# Patient Record
Sex: Male | Born: 1975 | Race: White | Hispanic: No | Marital: Married | Smoking: Former smoker
Health system: Southern US, Community
[De-identification: ages and names within clinical notes are randomized; demographics above are authoritative.]

## PROBLEM LIST (undated history)

## (undated) DIAGNOSIS — G8929 Other chronic pain: Secondary | ICD-10-CM

## (undated) DIAGNOSIS — K5720 Diverticulitis of large intestine with perforation and abscess without bleeding: Secondary | ICD-10-CM

## (undated) DIAGNOSIS — F411 Generalized anxiety disorder: Secondary | ICD-10-CM

## (undated) DIAGNOSIS — K219 Gastro-esophageal reflux disease without esophagitis: Secondary | ICD-10-CM

## (undated) DIAGNOSIS — K6389 Other specified diseases of intestine: Secondary | ICD-10-CM

## (undated) DIAGNOSIS — Z720 Tobacco use: Secondary | ICD-10-CM

## (undated) DIAGNOSIS — Z933 Colostomy status: Secondary | ICD-10-CM

## (undated) DIAGNOSIS — K5790 Diverticulosis of intestine, part unspecified, without perforation or abscess without bleeding: Secondary | ICD-10-CM

---
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
## 2005-01-13 ENCOUNTER — Emergency Department: Payer: Self-pay | Admitting: Emergency Medicine

## 2008-06-05 ENCOUNTER — Inpatient Hospital Stay: Payer: Self-pay | Admitting: Internal Medicine

## 2008-07-11 ENCOUNTER — Ambulatory Visit: Payer: Self-pay | Admitting: Surgery

## 2010-05-23 ENCOUNTER — Encounter: Payer: Self-pay | Admitting: Physician Assistant

## 2010-06-06 ENCOUNTER — Encounter: Payer: Self-pay | Admitting: Physician Assistant

## 2014-03-16 DIAGNOSIS — F411 Generalized anxiety disorder: Secondary | ICD-10-CM | POA: Insufficient documentation

## 2015-07-17 ENCOUNTER — Encounter: Payer: Self-pay | Admitting: Emergency Medicine

## 2015-07-17 ENCOUNTER — Emergency Department
Admission: EM | Admit: 2015-07-17 | Discharge: 2015-07-17 | Disposition: A | Discharge status: Home / Self Care | Payer: Managed Care, Other (non HMO) | Attending: Emergency Medicine | Admitting: Emergency Medicine

## 2015-07-17 DIAGNOSIS — K5732 Diverticulitis of large intestine without perforation or abscess without bleeding: Secondary | ICD-10-CM | POA: Insufficient documentation

## 2015-07-17 DIAGNOSIS — F17200 Nicotine dependence, unspecified, uncomplicated: Secondary | ICD-10-CM | POA: Diagnosis not present

## 2015-07-17 DIAGNOSIS — R1032 Left lower quadrant pain: Secondary | ICD-10-CM | POA: Diagnosis present

## 2015-07-17 HISTORY — DX: Diverticulosis of intestine, part unspecified, without perforation or abscess without bleeding: K57.90

## 2015-07-17 LAB — COMPREHENSIVE METABOLIC PANEL
ALBUMIN: 4.6 g/dL (ref 3.5–5.0)
ALK PHOS: 65 U/L (ref 38–126)
ALT: 20 U/L (ref 17–63)
ANION GAP: 7 (ref 5–15)
AST: 23 U/L (ref 15–41)
BILIRUBIN TOTAL: 0.9 mg/dL (ref 0.3–1.2)
BUN: 12 mg/dL (ref 6–20)
CALCIUM: 9.3 mg/dL (ref 8.9–10.3)
CO2: 25 mmol/L (ref 22–32)
CREATININE: 0.85 mg/dL (ref 0.61–1.24)
Chloride: 106 mmol/L (ref 101–111)
GFR calc Af Amer: >60 mL/min (ref >60)
GFR calc non Af Amer: >60 mL/min (ref >60)
GLUCOSE: 109 mg/dL — AB (ref 65–99)
Potassium: 3.9 mmol/L (ref 3.5–5.1)
SODIUM: 138 mmol/L (ref 135–145)
TOTAL PROTEIN: 8 g/dL (ref 6.5–8.1)

## 2015-07-17 LAB — URINALYSIS COMPLETE WITH MICROSCOPIC (ARMC ONLY)
BILIRUBIN URINE: NEGATIVE
Bacteria, UA: NONE SEEN
GLUCOSE, UA: NEGATIVE mg/dL
Hgb urine dipstick: NEGATIVE
KETONES UR: NEGATIVE mg/dL
Leukocytes, UA: NEGATIVE
NITRITE: NEGATIVE
PROTEIN: NEGATIVE mg/dL
SPECIFIC GRAVITY, URINE: 1.014 (ref 1.005–1.030)
pH: 8 (ref 5.0–8.0)

## 2015-07-17 LAB — CBC
HCT: 45 % (ref 40.0–52.0)
Hemoglobin: 15.2 g/dL (ref 13.0–18.0)
MCH: 30.5 pg (ref 26.0–34.0)
MCHC: 33.7 g/dL (ref 32.0–36.0)
MCV: 90.6 fL (ref 80.0–100.0)
PLATELETS: 238 10*3/uL (ref 150–440)
RBC: 4.97 MIL/uL (ref 4.40–5.90)
RDW: 12.7 % (ref 11.5–14.5)
WBC: 14.7 10*3/uL — ABNORMAL HIGH (ref 3.8–10.6)

## 2015-07-17 LAB — LIPASE, BLOOD: Lipase: 60 U/L — ABNORMAL HIGH (ref 11–51)

## 2015-07-17 MED ORDER — CIPROFLOXACIN HCL 500 MG PO TABS: 500.0000 mg | ORAL_TABLET | Freq: Two times a day (BID) | ORAL | Status: DC

## 2015-07-17 MED ORDER — CIPROFLOXACIN HCL 500 MG PO TABS
500.0000 mg | ORAL_TABLET | Freq: Once | ORAL | Status: AC
  Administered 2015-07-17: 500 mg via ORAL
  Filled 2015-07-17: qty 1

## 2015-07-17 MED ORDER — OXYCODONE-ACETAMINOPHEN 5-325 MG PO TABS: 1.0000 | ORAL_TABLET | Freq: Four times a day (QID) | ORAL | Status: AC | PRN

## 2015-07-17 MED ORDER — OXYCODONE-ACETAMINOPHEN 5-325 MG PO TABS
1.0000 | ORAL_TABLET | Freq: Once | ORAL | Status: AC
  Administered 2015-07-17: 1 via ORAL
  Filled 2015-07-17: qty 1

## 2015-07-17 MED ORDER — METRONIDAZOLE 500 MG PO TABS
500.0000 mg | ORAL_TABLET | Freq: Two times a day (BID) | ORAL | Status: DC
Start: 2015-07-17 — End: 2020-02-15

## 2015-07-17 MED ORDER — METRONIDAZOLE 500 MG PO TABS
500.0000 mg | ORAL_TABLET | Freq: Once | ORAL | Status: AC
  Administered 2015-07-17: 500 mg via ORAL
  Filled 2015-07-17: qty 1

## 2015-07-17 MED ORDER — DOCUSATE SODIUM 100 MG PO CAPS: 100.0000 mg | ORAL_CAPSULE | Freq: Every day | ORAL | Status: AC

## 2015-07-17 NOTE — ED Notes (Signed)
Pt to ed with c/o left lower quad pain that started last night, hx of diverticulitis.  Pt denies n/v/d.

## 2015-07-17 NOTE — ED Provider Notes (Signed)
[facility] Emergency Department Provider Note  ____________________________________________    I have reviewed the triage vital signs and the nursing notes.   HISTORY  Chief Complaint Abdominal Pain    HPI Louis Singh is a 40 y.o. male who presents with complaints of left lower quadrant abdominal pain. Patient reports the pain was mild at first but steadily worsened throughout the night. He reports this feels similar to diverticulitis which she has had twice in the past. He denies fevers or chills. No nausea or vomiting. He reports the pain is cramping in nature and constant.     Past Medical History  Diagnosis Date  . Diverticulosis     There are no active problems to display for this patient.   History reviewed. No pertinent past surgical history.  Current Outpatient Rx  Name  Route  Sig  Dispense  Refill  . ciprofloxacin (CIPRO) 500 MG tablet   Oral   Take 1 tablet (500 mg total) by mouth 2 (two) times daily.   14 tablet   0   . docusate sodium (COLACE) 100 MG capsule   Oral   Take 1 capsule (100 mg total) by mouth daily.   20 capsule   0   . metroNIDAZOLE (FLAGYL) 500 MG tablet   Oral   Take 1 tablet (500 mg total) by mouth 2 (two) times daily after a meal.   14 tablet   0   . oxyCODONE-acetaminophen (ROXICET) 5-325 MG tablet   Oral   Take 1 tablet by mouth every 6 (six) hours as needed.   20 tablet   0     Allergies Shellfish allergy  History reviewed. No pertinent family history.  Social History Social History  Substance Use Topics  . Smoking status: Current Every Day Smoker  . Smokeless tobacco: None  . Alcohol Use: Yes    Review of Systems  Constitutional: Negative for fever. Eyes: Negative for redness ENT: Negative for sore throat Cardiovascular: Negative for chest pain Respiratory: Negative for shortness of breath. No cough Gastrointestinal: As above Genitourinary: Negative for dysuria. No testicular  pain Musculoskeletal: Negative for back pain. Skin: Negative for rash. Neurological: Negative for dizziness Psychiatric: no anxiety    ____________________________________________   PHYSICAL EXAM:  VITAL SIGNS: ED Triage Vitals  Enc Vitals Group     BP 07/17/15 1121 124/77 mmHg     Pulse Rate 07/17/15 1121 72     Resp 07/17/15 1121 18     Temp 07/17/15 1121 98 F (36.7 C)     Temp Source 07/17/15 1121 Oral     SpO2 07/17/15 1121 97 %     Weight 07/17/15 1121 180 lb (81.647 kg)     Height 07/17/15 1121 5\' 10"  (1.778 m)     Head Cir --      Peak Flow --      Pain Score 07/17/15 1121 7     Pain Loc --      Pain Edu? --      Excl. in GC? --      Constitutional: Alert and oriented. Well appearing and in no distress.  Eyes: Conjunctivae are normal. No erythema or injection ENT   Head: Normocephalic and atraumatic.   Mouth/Throat: Mucous membranes are moist. Cardiovascular: Normal rate, regular rhythm. Normal and symmetric distal pulses are present in the upper extremities.  Respiratory: Normal respiratory effort without tachypnea nor retractions.  Gastrointestinal: Mild tender to palpation in the left lower quadrant. No peritoneal signs  No distention. There is no CVA tenderness. Genitourinary: deferred Musculoskeletal: Nontender with normal range of motion in all extremities. No lower extremity tenderness nor edema. Neurologic:  Normal speech and language. No gross focal neurologic deficits are appreciated. Skin:  Skin is warm, dry and intact. No rash noted. Psychiatric: Mood and affect are normal. Patient exhibits appropriate insight and judgment.  ____________________________________________    LABS (pertinent positives/negatives)  Labs Reviewed  LIPASE, BLOOD - Abnormal; Notable for the following:    Lipase 60 (*)    All other components within normal limits  COMPREHENSIVE METABOLIC PANEL - Abnormal; Notable for the following:    Glucose, Bld 109 (*)     All other components within normal limits  CBC - Abnormal; Notable for the following:    WBC 14.7 (*)    All other components within normal limits  URINALYSIS COMPLETEWITH MICROSCOPIC ([facility] ONLY) - Abnormal; Notable for the following:    Color, Urine YELLOW (*)    APPearance CLEAR (*)    Squamous Epithelial / LPF 0-5 (*)    All other components within normal limits    ____________________________________________   EKG  None  ____________________________________________    RADIOLOGY  None  ____________________________________________   PROCEDURES  Procedure(s) performed: none  Critical Care performed: none  ____________________________________________   INITIAL IMPRESSION / ASSESSMENT AND PLAN / ED COURSE  Pertinent labs & imaging results that were available during my care of the patient were reviewed by me and considered in my medical decision making (see chart for details).  Patient presents with complaints of left lower quadrant pain. He does have a mildly elevated white blood cell count. His exam history of present illness and elevated white blood cell count is consistent with diverticulitis. He reports he has had good success with by mouth antibiotics in the past. We will give Cipro and Flagyl analgesics. I discussed with him extensively the need to return if worsening pain and he agrees  ____________________________________________   FINAL CLINICAL IMPRESSION(S) / ED DIAGNOSES  Final diagnoses:  Diverticulitis of large intestine without perforation or abscess without bleeding          Jene Every, MD 07/17/15 1444

## 2015-07-17 NOTE — Discharge Instructions (Signed)
Diverticulitis °Diverticulitis is inflammation or infection of small pouches in your colon that form when you have a condition called diverticulosis. The pouches in your colon are called diverticula. Your colon, or large intestine, is where water is absorbed and stool is formed. °Complications of diverticulitis can include: °· Bleeding. °· Severe infection. °· Severe pain. °· Perforation of your colon. °· Obstruction of your colon. °CAUSES  °Diverticulitis is caused by bacteria. °Diverticulitis happens when stool becomes trapped in diverticula. This allows bacteria to grow in the diverticula, which can lead to inflammation and infection. °RISK FACTORS °People with diverticulosis are at risk for diverticulitis. Eating a diet that does not include enough fiber from fruits and vegetables may make diverticulitis more likely to develop. °SYMPTOMS  °Symptoms of diverticulitis may include: °· Abdominal pain and tenderness. The pain is normally located on the left side of the abdomen, but may occur in other areas. °· Fever and chills. °· Bloating. °· Cramping. °· Nausea. °· Vomiting. °· Constipation. °· Diarrhea. °· Blood in your stool. °DIAGNOSIS  °Your health care provider will ask you about your medical history and do a physical exam. You may need to have tests done because many medical conditions can cause the same symptoms as diverticulitis. Tests may include: °· Blood tests. °· Urine tests. °· Imaging tests of the abdomen, including X-rays and CT scans. °When your condition is under control, your health care provider may recommend that you have a colonoscopy. A colonoscopy can show how severe your diverticula are and whether something else is causing your symptoms. °TREATMENT  °Most cases of diverticulitis are mild and can be treated at home. Treatment may include: °· Taking over-the-counter pain medicines. °· Following a clear liquid diet. °· Taking antibiotic medicines by mouth for 7-10 days. °More severe cases may  be treated at a hospital. Treatment may include: °· Not eating or drinking. °· Taking prescription pain medicine. °· Receiving antibiotic medicines through an IV tube. °· Receiving fluids and nutrition through an IV tube. °· Surgery. °HOME CARE INSTRUCTIONS  °· Follow your health care provider's instructions carefully. °· Follow a full liquid diet or other diet as directed by your health care provider. After your symptoms improve, your health care provider may tell you to change your diet. He or she may recommend you eat a high-fiber diet. Fruits and vegetables are good sources of fiber. Fiber makes it easier to pass stool. °· Take fiber supplements or probiotics as directed by your health care provider. °· Only take medicines as directed by your health care provider. °· Keep all your follow-up appointments. °SEEK MEDICAL CARE IF:  °· Your pain does not improve. °· You have a hard time eating food. °· Your bowel movements do not return to normal. °SEEK IMMEDIATE MEDICAL CARE IF:  °· Your pain becomes worse. °· Your symptoms do not get better. °· Your symptoms suddenly get worse. °· You have a fever. °· You have repeated vomiting. °· You have bloody or black, tarry stools. °MAKE SURE YOU:  °· Understand these instructions. °· Will watch your condition. °· Will get help right away if you are not doing well or get worse. °  °This information is not intended to replace advice given to you by your health care provider. Make sure you discuss any questions you have with your health care provider. °  °Document Released: 01/30/2005 Document Revised: 04/27/2013 Document Reviewed: 03/17/2013 °Elsevier Interactive Patient Education ©2016 [employer] ° °

## 2016-01-31 DIAGNOSIS — Z7185 Encounter for immunization safety counseling: Secondary | ICD-10-CM | POA: Insufficient documentation

## 2020-02-15 ENCOUNTER — Emergency Department: Payer: Self-pay

## 2020-02-15 ENCOUNTER — Other Ambulatory Visit: Payer: Self-pay

## 2020-02-15 ENCOUNTER — Inpatient Hospital Stay
Admission: EM | Admit: 2020-02-15 | Discharge: 2020-02-23 | DRG: 329 | Disposition: A | Discharge status: Home / Self Care | Payer: Self-pay | Attending: Surgery | Admitting: Surgery

## 2020-02-15 DIAGNOSIS — Z720 Tobacco use: Secondary | ICD-10-CM | POA: Diagnosis present

## 2020-02-15 DIAGNOSIS — Z20822 Contact with and (suspected) exposure to covid-19: Secondary | ICD-10-CM | POA: Diagnosis present

## 2020-02-15 DIAGNOSIS — K5720 Diverticulitis of large intestine with perforation and abscess without bleeding: Principal | ICD-10-CM | POA: Diagnosis present

## 2020-02-15 DIAGNOSIS — K5900 Constipation, unspecified: Secondary | ICD-10-CM | POA: Diagnosis present

## 2020-02-15 DIAGNOSIS — D72829 Elevated white blood cell count, unspecified: Secondary | ICD-10-CM

## 2020-02-15 DIAGNOSIS — Z91013 Allergy to seafood: Secondary | ICD-10-CM

## 2020-02-15 DIAGNOSIS — K5792 Diverticulitis of intestine, part unspecified, without perforation or abscess without bleeding: Secondary | ICD-10-CM | POA: Diagnosis present

## 2020-02-15 DIAGNOSIS — K660 Peritoneal adhesions (postprocedural) (postinfection): Secondary | ICD-10-CM | POA: Diagnosis present

## 2020-02-15 DIAGNOSIS — K5780 Diverticulitis of intestine, part unspecified, with perforation and abscess without bleeding: Principal | ICD-10-CM | POA: Insufficient documentation

## 2020-02-15 DIAGNOSIS — F17200 Nicotine dependence, unspecified, uncomplicated: Secondary | ICD-10-CM | POA: Diagnosis present

## 2020-02-15 DIAGNOSIS — K658 Other peritonitis: Secondary | ICD-10-CM | POA: Diagnosis present

## 2020-02-15 HISTORY — DX: Tobacco use: Z72.0

## 2020-02-15 LAB — PROTIME-INR
INR: 1.1 (ref 0.8–1.2)
Prothrombin Time: 13.7 seconds (ref 11.4–15.2)

## 2020-02-15 LAB — URINALYSIS, COMPLETE (UACMP) WITH MICROSCOPIC
Bacteria, UA: NONE SEEN
Bilirubin Urine: NEGATIVE
Glucose, UA: NEGATIVE mg/dL
Ketones, ur: 20 mg/dL — AB
Leukocytes,Ua: NEGATIVE
Nitrite: NEGATIVE
Protein, ur: NEGATIVE mg/dL
Specific Gravity, Urine: >1.046 — ABNORMAL HIGH (ref 1.005–1.030)
pH: 5 (ref 5.0–8.0)

## 2020-02-15 LAB — CBC
HCT: 42.2 % (ref 39.0–52.0)
Hemoglobin: 14 g/dL (ref 13.0–17.0)
MCH: 30.2 pg (ref 26.0–34.0)
MCHC: 33.2 g/dL (ref 30.0–36.0)
MCV: 90.9 fL (ref 80.0–100.0)
Platelets: 379 10*3/uL (ref 150–400)
RBC: 4.64 MIL/uL (ref 4.22–5.81)
RDW: 12.2 % (ref 11.5–15.5)
WBC: 7.3 10*3/uL (ref 4.0–10.5)
nRBC: 0 % (ref 0.0–0.2)

## 2020-02-15 LAB — RESPIRATORY PANEL BY RT PCR (FLU A&B, COVID)
Influenza A by PCR: NEGATIVE
Influenza B by PCR: NEGATIVE
SARS Coronavirus 2 by RT PCR: NEGATIVE

## 2020-02-15 LAB — COMPREHENSIVE METABOLIC PANEL
ALT: 17 U/L (ref 0–44)
AST: 17 U/L (ref 15–41)
Albumin: 3.8 g/dL (ref 3.5–5.0)
Alkaline Phosphatase: 79 U/L (ref 38–126)
Anion gap: 11 (ref 5–15)
BUN: 13 mg/dL (ref 6–20)
CO2: 25 mmol/L (ref 22–32)
Calcium: 9.3 mg/dL (ref 8.9–10.3)
Chloride: 102 mmol/L (ref 98–111)
Creatinine, Ser: 0.98 mg/dL (ref 0.61–1.24)
GFR, Estimated: >60 mL/min (ref >60)
Glucose, Bld: 138 mg/dL — ABNORMAL HIGH (ref 70–99)
Potassium: 4 mmol/L (ref 3.5–5.1)
Sodium: 138 mmol/L (ref 135–145)
Total Bilirubin: 0.6 mg/dL (ref 0.3–1.2)
Total Protein: 8.1 g/dL (ref 6.5–8.1)

## 2020-02-15 LAB — TYPE AND SCREEN
ABO/RH(D): A POS
Antibody Screen: NEGATIVE

## 2020-02-15 LAB — LIPASE, BLOOD: Lipase: 24 U/L (ref 11–51)

## 2020-02-15 LAB — APTT: aPTT: 32 seconds (ref 24–36)

## 2020-02-15 MED ORDER — HEPARIN SODIUM (PORCINE) 5000 UNIT/ML IJ SOLN
5000.0000 [IU] | Freq: Three times a day (TID) | INTRAMUSCULAR | Status: DC
  Administered 2020-02-15 – 2020-02-17 (×3): 5000 [IU] via SUBCUTANEOUS
  Filled 2020-02-15 (×5): qty 1

## 2020-02-15 MED ORDER — ACETAMINOPHEN 325 MG PO TABS: 650.0000 mg | ORAL_TABLET | Freq: Four times a day (QID) | ORAL | Status: DC | PRN

## 2020-02-15 MED ORDER — FENTANYL CITRATE (PF) 100 MCG/2ML IJ SOLN
50.0000 ug | Freq: Once | INTRAMUSCULAR | Status: AC
  Administered 2020-02-15: 50 ug via INTRAVENOUS
  Filled 2020-02-15: qty 2

## 2020-02-15 MED ORDER — NICOTINE 21 MG/24HR TD PT24
21.0000 mg | MEDICATED_PATCH | Freq: Every day | TRANSDERMAL | Status: DC
  Filled 2020-02-15: qty 1

## 2020-02-15 MED ORDER — KETOROLAC TROMETHAMINE 30 MG/ML IJ SOLN
30.0000 mg | Freq: Four times a day (QID) | INTRAMUSCULAR | Status: AC
  Administered 2020-02-15 – 2020-02-20 (×19): 30 mg via INTRAVENOUS
  Filled 2020-02-15 (×19): qty 1

## 2020-02-15 MED ORDER — ACETAMINOPHEN 500 MG PO TABS: 1000.0000 mg | ORAL_TABLET | Freq: Four times a day (QID) | ORAL | Status: DC | PRN

## 2020-02-15 MED ORDER — SODIUM CHLORIDE 0.9 % IV SOLN: INTRAVENOUS | Status: DC

## 2020-02-15 MED ORDER — LACTATED RINGERS IV BOLUS
1000.0000 mL | Freq: Once | INTRAVENOUS | Status: AC
  Administered 2020-02-15: 1000 mL via INTRAVENOUS

## 2020-02-15 MED ORDER — HYDROMORPHONE HCL 1 MG/ML IJ SOLN
0.5000 mg | Freq: Once | INTRAMUSCULAR | Status: AC
  Administered 2020-02-15: 0.5 mg via INTRAVENOUS
  Filled 2020-02-15: qty 1

## 2020-02-15 MED ORDER — MORPHINE SULFATE (PF) 2 MG/ML IV SOLN: 2.0000 mg | INTRAVENOUS | Status: DC | PRN

## 2020-02-15 MED ORDER — ONDANSETRON HCL 4 MG/2ML IJ SOLN
4.0000 mg | Freq: Three times a day (TID) | INTRAMUSCULAR | Status: DC | PRN
  Administered 2020-02-15: 4 mg via INTRAVENOUS
  Filled 2020-02-15: qty 2

## 2020-02-15 MED ORDER — HYDROMORPHONE HCL 1 MG/ML IJ SOLN
1.0000 mg | INTRAMUSCULAR | Status: DC | PRN
  Administered 2020-02-15: 1 mg via INTRAVENOUS
  Filled 2020-02-15: qty 1

## 2020-02-15 MED ORDER — PIPERACILLIN-TAZOBACTAM 3.375 G IVPB
3.3750 g | Freq: Three times a day (TID) | INTRAVENOUS | Status: DC
  Administered 2020-02-15 – 2020-02-23 (×23): 3.375 g via INTRAVENOUS
  Filled 2020-02-15 (×21): qty 50

## 2020-02-15 MED ORDER — SODIUM CHLORIDE 0.9 % IV SOLN
1.0000 g | Freq: Once | INTRAVENOUS | Status: DC
Start: 2020-02-15 — End: 2020-02-15
  Filled 2020-02-15: qty 10

## 2020-02-15 MED ORDER — MORPHINE SULFATE (PF) 2 MG/ML IV SOLN
2.0000 mg | INTRAVENOUS | Status: DC | PRN
  Administered 2020-02-15: 2 mg via INTRAVENOUS
  Filled 2020-02-15: qty 1

## 2020-02-15 MED ORDER — IOHEXOL 300 MG/ML  SOLN
100.0000 mL | Freq: Once | INTRAMUSCULAR | Status: AC | PRN
  Administered 2020-02-15: 100 mL via INTRAVENOUS

## 2020-02-15 MED ORDER — ONDANSETRON HCL 4 MG/2ML IJ SOLN
4.0000 mg | Freq: Once | INTRAMUSCULAR | Status: AC
  Administered 2020-02-15: 4 mg via INTRAVENOUS
  Filled 2020-02-15: qty 2

## 2020-02-15 MED ORDER — METRONIDAZOLE IN NACL 5-0.79 MG/ML-% IV SOLN
500.0000 mg | Freq: Once | INTRAVENOUS | Status: DC
  Filled 2020-02-15: qty 100

## 2020-02-15 MED ORDER — PANTOPRAZOLE SODIUM 40 MG IV SOLR
40.0000 mg | INTRAVENOUS | Status: DC
  Administered 2020-02-15 – 2020-02-22 (×7): 40 mg via INTRAVENOUS
  Filled 2020-02-15 (×8): qty 40

## 2020-02-15 MED ORDER — HYDROMORPHONE HCL 1 MG/ML IJ SOLN: 0.5000 mg | INTRAMUSCULAR | Status: DC | PRN

## 2020-02-15 NOTE — H&P (Addendum)
History and Physical    Louis Singh QVZ:[id number] DOB: [birth year] DOA: 02/15/2020  Referring MD/NP/PA:   PCP: Marisue Ivan, MD   Patient coming from:  The patient is coming from home.  At baseline, pt is independent for most of ADL.        Chief Complaint: Abdominal pain  HPI: Louis Singh is a 44 y.o. male with medical history significant of tobacco abuse, diverticulitis, who presents with abdominal pain.  Patient states that he has history of multiple episodes of diverticulitis.  He developed abdominal pain again 4 days ago, which is located in the right lower quadrant, constant, moderate to severe, sharp, radiating to the upper abdomen. He has nausea, but no vomiting or diarrhea. Patient does not have fever or chills.  No chest pain, shortness breath, cough.  Patient stated he has burning on urination and dysuria recently.  No urinary frequency.  No hematuria or bloody stool. He states he initially had some constipation in the course of his symptoms but took over-the-counter laxatives and has had some loose stools over the last 2 days.    ED Course: pt was found to have WBC 7.3, lipase 24, pending UA, pending Covid PCR, electrolytes renal function okay, temperature normal, blood pressure 118/78, heart rate 71, RR 19, oxygen saturation 97% on room air.  Patient is placed on MedSurg bed for patient.  CT scan showed acute sigmoid colon diverticulitis with evidence of microperforation.  General surgeon, Dr. Aleen Campi is consulted.  CT abdomen/pelvis: 1. Acute sigmoid colon diverticulitis with evidence of microperforation. No definite abscess identified. 2. Small volume intraperitoneal free fluid. 3. Small hiatal hernia.   Review of Systems:   General: no fevers, has chills, no body weight gain, has poor appetite, has fatigue HEENT: no blurry vision, hearing changes or sore throat Respiratory: no dyspnea, coughing, wheezing CV: no chest pain, no palpitations GI: has nausea,  abdominal pain, constipation, no vomiting, diarrhea, GU: has dysuria, burning on urination, no increased urinary frequency, hematuria  Ext: no leg edema Neuro: no unilateral weakness, numbness, or tingling, no vision change or hearing loss Skin: no rash, no skin tear. MSK: No muscle spasm, no deformity, no limitation of range of movement in spin Heme: No easy bruising.  Travel history: No recent long distant travel.  Allergy:  Allergies  Allergen Reactions  . Shellfish Allergy Nausea And Vomiting    Past Medical History:  Diagnosis Date  . Diverticulosis   . Tobacco abuse     History reviewed. No pertinent surgical history.  Social History:  reports that he has been smoking. He does not have any smokeless tobacco history on file. He reports current alcohol use. He reports that he does not use drugs.  Family History:  Family History  Problem Relation Age of Onset  . Diabetes Mellitus II Father   . Diverticulitis Sister      Prior to Admission medications   Medication Sig Start Date End Date Taking? Authorizing Provider  ciprofloxacin (CIPRO) 500 MG tablet Take 1 tablet (500 mg total) by mouth 2 (two) times daily. 07/17/15   Jene Every, MD  metroNIDAZOLE (FLAGYL) 500 MG tablet Take 1 tablet (500 mg total) by mouth 2 (two) times daily after a meal. 07/17/15   Jene Every, MD    Physical Exam: Vitals:   02/15/20 0801 02/15/20 0802 02/15/20 1200  BP:  118/78 122/70  Pulse:  71 93  Resp:  19   Temp:  97.9 F (36.6 C)   SpO2:  97% 94%  Weight: 77.1 kg    Height: 5\' 10"  (1.778 m)     General: Not in acute distress HEENT:       Eyes: PERRL, EOMI, no scleral icterus.       ENT: No discharge from the ears and nose, no pharynx injection, no tonsillar enlargement.        Neck: No JVD, no bruit, no mass felt. Heme: No neck lymph node enlargement. Cardiac: S1/S2, RRR, No murmurs, No gallops or rubs. Respiratory: No rales, wheezing, rhonchi or rubs. GI: Soft,  nondistended, has tenderness in RLQ, no rebound pain, no organomegaly, BS present. GU: No hematuria Ext: No pitting leg edema bilaterally. 2+DP/PT pulse bilaterally. Musculoskeletal: No joint deformities, No joint redness or warmth, no limitation of ROM in spin. Skin: No rashes.  Neuro: Alert, oriented X3, cranial nerves II-XII grossly intact, moves all extremities normally. Psych: Patient is not psychotic, no suicidal or hemocidal ideation.  Labs on Admission: I have personally reviewed following labs and imaging studies  CBC: Recent Labs  Lab 02/15/20 0805  WBC 7.3  HGB 14.0  HCT 42.2  MCV 90.9  PLT 379   Basic Metabolic Panel: Recent Labs  Lab 02/15/20 0805  NA 138  K 4.0  CL 102  CO2 25  GLUCOSE 138*  BUN 13  CREATININE 0.98  CALCIUM 9.3   GFR: Estimated Creatinine Clearance: 101.4 mL/min (by C-G formula based on SCr of 0.98 mg/dL). Liver Function Tests: Recent Labs  Lab 02/15/20 0805  AST 17  ALT 17  ALKPHOS 79  BILITOT 0.6  PROT 8.1  ALBUMIN 3.8   Recent Labs  Lab 02/15/20 0805  LIPASE 24   No results for input(s): AMMONIA in the last 168 hours. Coagulation Profile: No results for input(s): INR, PROTIME in the last 168 hours. Cardiac Enzymes: No results for input(s): CKTOTAL, CKMB, CKMBINDEX, TROPONINI in the last 168 hours. BNP (last 3 results) No results for input(s): PROBNP in the last 8760 hours. HbA1C: No results for input(s): HGBA1C in the last 72 hours. CBG: No results for input(s): GLUCAP in the last 168 hours. Lipid Profile: No results for input(s): CHOL, HDL, LDLCALC, TRIG, CHOLHDL, LDLDIRECT in the last 72 hours. Thyroid Function Tests: No results for input(s): TSH, T4TOTAL, FREET4, T3FREE, THYROIDAB in the last 72 hours. Anemia Panel: No results for input(s): VITAMINB12, FOLATE, FERRITIN, TIBC, IRON, RETICCTPCT in the last 72 hours. Urine analysis:    Component Value Date/Time   COLORURINE YELLOW (A) 02/15/2020 0805    APPEARANCEUR CLEAR (A) 02/15/2020 0805   LABSPEC >1.046 (H) 02/15/2020 0805   PHURINE 5.0 02/15/2020 0805   GLUCOSEU NEGATIVE 02/15/2020 0805   HGBUR SMALL (A) 02/15/2020 0805   BILIRUBINUR NEGATIVE 02/15/2020 0805   KETONESUR 20 (A) 02/15/2020 0805   PROTEINUR NEGATIVE 02/15/2020 0805   NITRITE NEGATIVE 02/15/2020 0805   LEUKOCYTESUR NEGATIVE 02/15/2020 0805   Sepsis Labs: @LABRCNTIP (procalcitonin:4,lacticidven:4) )No results found for this or any previous visit (from the past 240 hour(s)).   Radiological Exams on Admission: CT ABDOMEN PELVIS W CONTRAST  Result Date: 02/15/2020 CLINICAL DATA:  Right lower quadrant pain. History of diverticulitis. EXAM: CT ABDOMEN AND PELVIS WITH CONTRAST TECHNIQUE: Multidetector CT imaging of the abdomen and pelvis was performed using the standard protocol following bolus administration of intravenous contrast. CONTRAST:  OMNIPAQUE IOHEXOL 300 MG/ML  SOLN COMPARISON:  06/05/2008 FINDINGS: Lower chest: Clear lung bases. Hepatobiliary: No focal liver abnormality is seen. No gallstones, gallbladder wall thickening, or biliary dilatation.  Pancreas: Unremarkable. Spleen: Unremarkable. Adrenals/Urinary Tract: Unremarkable adrenal glands. No evidence of renal mass, calculi, or hydronephrosis. Unremarkable bladder. Stomach/Bowel: There is a small sliding hiatal hernia. There is a moderate amount of stool in the ascending and transverse colon without evidence of bowel obstruction. There is diverticulosis of the sigmoid colon, and there is moderate sigmoid colon wall thickening with prominent surrounding inflammation. Prominent enhancement of adjacent nondilated small bowel loops likely reflects secondary involvement by the primary colonic process. There are are a few small foci of extraluminal gas in the pelvis/lower abdomen. No definite fluid collection is identified, however assessment is limited by absence of oral contrast as there are multiple fluid-filled small  bowel loops in the pelvis. Vascular/Lymphatic: Normal caliber of the abdominal aorta. Retroaortic left renal vein. Prominent number of small para-aortic lymph nodes measuring up to 6 mm in short axis, likely reactive. Reproductive: Unremarkable prostate. Other: Small volume intraperitoneal free fluid. Musculoskeletal: No acute osseous abnormality or suspicious osseous lesion. IMPRESSION: 1. Acute sigmoid colon diverticulitis with evidence of microperforation. No definite abscess identified. 2. Small volume intraperitoneal free fluid. 3. Small hiatal hernia. Electronically Signed   By: Sebastian Ache M.D.   On: 02/15/2020 10:11     EKG:  Not done in ED, will get one.   Assessment/Plan Principal Problem:   Acute diverticulitis Active Problems:   Tobacco abuse   Acute diverticulitis with microperforation: CT showed acute sigmoid colon diverticulitis with evidence of microperforation, but no definite abscess identified. Pt does not have fever or leukocytosis.  Does not have sepsis.  Currently hemodynamically stable.  General surgeon, Dr. Aleen Campi is consulted.  -Placed on MedSurg bed for observation -Started Zosyn IV -Blood culture -As needed Zofran and dilaudid -IV fluid: 1 L of LR in ED, then 125 cc/h of normal saline   Tobacco abuse: Patient states that he does not smoke cigarettes currently, but dose vaping -Nicotine patch    DVT ppx: SQ Heparin    Code Status: Full code Family Communication:  Yes, patient's wife at bed side Disposition Plan:  Anticipate discharge back to previous environment Consults called:  Dr. Aleen Campi of general surgeon Admission status: Med-surg bed for obs   Status is: Observation  The patient remains OBS appropriate and will d/c before 2 midnights.  Dispo: The patient is from: Home              Anticipated d/c is to: Home              Anticipated d/c date is: 1 day              Patient currently is not medically stable to d/c.          Date of  Service 02/15/2020    Lorretta Harp [facility]   If 7PM-7AM, please contact night-coverage [email or web address] 02/15/2020, 1:49 PM

## 2020-02-15 NOTE — ED Triage Notes (Signed)
Pt comes via POV from home with c/o RLQ pain that started this past Friday. Pt states this am it was intense. Pt states hx of diverticulitis.  Pt states pain and burning with urination.

## 2020-02-15 NOTE — Progress Notes (Signed)
Pharmacy Antibiotic Note  Louis Singh is a 44 y.o. male admitted on 02/15/2020 with acute diverticulitis .  Pharmacy has been consulted for Zosyn dosing.  Plan: Zosyn 3.375g IV q8h (4 hour infusion).  Height: 5\' 10"  (177.8 cm) Weight: 77.1 kg (170 lb) IBW/kg (Calculated) : 73  Temp (24hrs), Avg:97.9 F (36.6 C), Min:97.9 F (36.6 C), Max:97.9 F (36.6 C)  Recent Labs  Lab 02/15/20 0805  WBC 7.3  CREATININE 0.98    Estimated Creatinine Clearance: 101.4 mL/min (by C-G formula based on SCr of 0.98 mg/dL).    Allergies  Allergen Reactions  . Shellfish Allergy Nausea And Vomiting    Antimicrobials this admission: Zosyn 10/12 >>  Dose adjustments this admission:   Microbiology results: 10/12 BCx: ordered   Thank you for allowing pharmacy to be a part of this patient's care.  12/12 02/15/2020 11:32 AM

## 2020-02-15 NOTE — ED Provider Notes (Signed)
[facility] Emergency Department Provider Note  ____________________________________________   First MD Initiated Contact with Patient 02/15/20 [phone]     (approximate)  I have reviewed the triage vital signs and the nursing notes.   HISTORY  Chief Complaint RLQ pain   HPI Louis Singh is a 44 y.o. male with a past medical history of diverticulosis complicated by multiple episodes of diverticulitis who presents for assessment of approximately 4 days of right lower quadrant abdominal pain rating to the right upper quadrant and back.  Patient states the pain feels similar to his diverticulitis although his usual pain is on the left and on the right.  He endorses some intermittent nausea but denies any vomiting, burning with urination, blood in his urine, blood in his stool, chest pain, cough, headache, earache, or sore throat.  He states he initially had some constipation in the course of his symptoms but took over-the-counter laxatives and has had some loose stools over the last 2 days.  He endorses some chills but denies any fevers.  Denies EtOH or illicit drug use.  No other clear alleviating or aggravating factors.         Past Medical History:  Diagnosis Date  . Diverticulosis     Patient Active Problem List   Diagnosis Date Noted  . Acute diverticulitis 02/15/2020    History reviewed. No pertinent surgical history.  Prior to Admission medications   Medication Sig Start Date End Date Taking? Authorizing Provider  ciprofloxacin (CIPRO) 500 MG tablet Take 1 tablet (500 mg total) by mouth 2 (two) times daily. 07/17/15   Jene Every, MD  metroNIDAZOLE (FLAGYL) 500 MG tablet Take 1 tablet (500 mg total) by mouth 2 (two) times daily after a meal. 07/17/15   Jene Every, MD    Allergies Shellfish allergy  No family history on file.  Social History Social History   Tobacco Use  . Smoking status: Current Every Day Smoker  Substance Use Topics    . Alcohol use: Yes  . Drug use: No    Review of Systems  Review of Systems  Constitutional: Negative for chills and fever.  HENT: Negative for sore throat.   Eyes: Negative for pain.  Respiratory: Negative for cough and stridor.   Cardiovascular: Negative for chest pain.  Gastrointestinal: Positive for abdominal pain and nausea. Negative for vomiting.  Genitourinary: Positive for flank pain. Negative for dysuria.  Skin: Negative for rash.  Neurological: Negative for seizures, loss of consciousness and headaches.  Psychiatric/Behavioral: Negative for suicidal ideas.  All other systems reviewed and are negative.     ____________________________________________   PHYSICAL EXAM:  VITAL SIGNS: ED Triage Vitals  Enc Vitals Group     BP 02/15/20 0802 118/78     Pulse Rate 02/15/20 0802 71     Resp 02/15/20 0802 19     Temp 02/15/20 0802 97.9 F (36.6 C)     Temp src --      SpO2 02/15/20 0802 97 %     Weight 02/15/20 0801 170 lb (77.1 kg)     Height 02/15/20 0801 5\' 10"  (1.778 m)     Head Circumference --      Peak Flow --      Pain Score 02/15/20 0801 10     Pain Loc --      Pain Edu? --      Excl. in GC? --    Vitals:   02/15/20 0802  BP: 118/78  Pulse: 71  Resp: 19  Temp: 97.9 F (36.6 C)  SpO2: 97%   Physical Exam Vitals and nursing note reviewed.  Constitutional:      Appearance: He is well-developed.  HENT:     Head: Normocephalic and atraumatic.     Right Ear: External ear normal.     Left Ear: External ear normal.     Nose: Nose normal.  Eyes:     Conjunctiva/sclera: Conjunctivae normal.  Cardiovascular:     Rate and Rhythm: Normal rate and regular rhythm.     Heart sounds: No murmur heard.   Pulmonary:     Effort: Pulmonary effort is normal. No respiratory distress.     Breath sounds: Normal breath sounds.  Abdominal:     Palpations: Abdomen is soft.     Tenderness: There is abdominal tenderness in the right upper quadrant, right lower  quadrant and periumbilical area. There is guarding. There is no right CVA tenderness or left CVA tenderness.  Musculoskeletal:     Cervical back: Neck supple.  Skin:    General: Skin is warm and dry.     Capillary Refill: Capillary refill takes less than 2 seconds.  Neurological:     Mental Status: He is alert and oriented to person, place, and time.  Psychiatric:        Mood and Affect: Mood normal.     Scrotal and inguinal exam is unremarkable without tenderness or evidence of hernia. ____________________________________________   LABS (all labs ordered are listed, but only abnormal results are displayed)  Labs Reviewed  COMPREHENSIVE METABOLIC PANEL - Abnormal; Notable for the following components:      Result Value   Glucose, Bld 138 (*)    All other components within normal limits  RESPIRATORY PANEL BY RT PCR (FLU A&B, COVID)  CULTURE, BLOOD (ROUTINE X 2)  CULTURE, BLOOD (ROUTINE X 2)  LIPASE, BLOOD  CBC  URINALYSIS, COMPLETE (UACMP) WITH MICROSCOPIC   ____________________________________________  ____________________________________________  RADIOLOGY  Official radiology report(s): CT ABDOMEN PELVIS W CONTRAST  Result Date: 02/15/2020 CLINICAL DATA:  Right lower quadrant pain. History of diverticulitis. EXAM: CT ABDOMEN AND PELVIS WITH CONTRAST TECHNIQUE: Multidetector CT imaging of the abdomen and pelvis was performed using the standard protocol following bolus administration of intravenous contrast. CONTRAST:  OMNIPAQUE IOHEXOL 300 MG/ML  SOLN COMPARISON:  06/05/2008 FINDINGS: Lower chest: Clear lung bases. Hepatobiliary: No focal liver abnormality is seen. No gallstones, gallbladder wall thickening, or biliary dilatation. Pancreas: Unremarkable. Spleen: Unremarkable. Adrenals/Urinary Tract: Unremarkable adrenal glands. No evidence of renal mass, calculi, or hydronephrosis. Unremarkable bladder. Stomach/Bowel: There is a small sliding hiatal hernia. There is a  moderate amount of stool in the ascending and transverse colon without evidence of bowel obstruction. There is diverticulosis of the sigmoid colon, and there is moderate sigmoid colon wall thickening with prominent surrounding inflammation. Prominent enhancement of adjacent nondilated small bowel loops likely reflects secondary involvement by the primary colonic process. There are are a few small foci of extraluminal gas in the pelvis/lower abdomen. No definite fluid collection is identified, however assessment is limited by absence of oral contrast as there are multiple fluid-filled small bowel loops in the pelvis. Vascular/Lymphatic: Normal caliber of the abdominal aorta. Retroaortic left renal vein. Prominent number of small para-aortic lymph nodes measuring up to 6 mm in short axis, likely reactive. Reproductive: Unremarkable prostate. Other: Small volume intraperitoneal free fluid. Musculoskeletal: No acute osseous abnormality or suspicious osseous lesion. IMPRESSION: 1. Acute sigmoid colon diverticulitis with evidence of microperforation.  No definite abscess identified. 2. Small volume intraperitoneal free fluid. 3. Small hiatal hernia. Electronically Signed   By: Sebastian Ache M.D.   On: 02/15/2020 10:11    ____________________________________________   PROCEDURES  Procedure(s) performed (including Critical Care):  Procedures   ____________________________________________   INITIAL IMPRESSION / ASSESSMENT AND PLAN / ED COURSE        Patient presents for assessment of abdominal pain that got acutely worse last 24 hours.  Patient is afebrile hemodynamically stable arrival.  Exam as above remarkable for some tenderness in the right lower quadrant with some guarding.  Differential includes but is not limited to appendicitis, diverticulitis, kidney stone, pancreatitis, cholecystitis, cystitis, and torsion.  No evidence on exam of inguinal hernia or torsion.  CT remarkable for evidence of  diverticulitis with microperforation.  No evidence of appendicitis, kidney stone, pancreatitis, or cholecystitis.  In addition patient's lipase is WNL and not consistent with acute pancreatitis and CMP does not show evidence of cholestasis.  No perinephric or bladder stranding although will plan to obtain UA.  Below noted analgesia and antibiotics ordered in the ED.  I did consult surgery service with Dr. Aleen Campi he stated he would see the patient but recommended medicine admission.  I will plan to admit to hospital service for further evaluation management.   ____________________________________________   FINAL CLINICAL IMPRESSION(S) / ED DIAGNOSES  Final diagnoses:  Perforated diverticulum  Diverticulitis    Medications  HYDROmorphone (DILAUDID) injection 0.5 mg (has no administration in time range)  0.9 %  sodium chloride infusion (has no administration in time range)  ondansetron (ZOFRAN) injection 4 mg (has no administration in time range)  acetaminophen (TYLENOL) tablet 650 mg (has no administration in time range)  nicotine (NICODERM CQ - dosed in mg/24 hours) patch 21 mg (has no administration in time range)  morphine 2 MG/ML injection 2 mg (has no administration in time range)  lactated ringers bolus 1,000 mL (0 mLs Intravenous Stopped 02/15/20 1100)  fentaNYL (SUBLIMAZE) injection 50 mcg (50 mcg Intravenous Given 02/15/20 0933)  ondansetron (ZOFRAN) injection 4 mg (4 mg Intravenous Given 02/15/20 0936)  iohexol (OMNIPAQUE) 300 MG/ML solution 100 mL (100 mLs Intravenous Contrast Given 02/15/20 0949)     ED Discharge Orders    None       Note:  This document was prepared using Dragon voice recognition software and may include unintentional dictation errors.   Gilles Chiquito, MD 02/15/20 [phone]

## 2020-02-15 NOTE — ED Notes (Signed)
Report called to [place] rn floor nurse

## 2020-02-15 NOTE — Consult Note (Signed)
[facility] SURGICAL CONSULTATION NOTE (initial) - cpt: 17616   HISTORY OF PRESENT ILLNESS (HPI):  44 y.o. male presented to [facility] ED today for evaluation of abdominal pain. Patient reports he initially noticed some lower abdominal discomfort around Friday/Saturday. He believed this was related to constipation at first however the pain continue to progress and became more constant and severe. He described this as a severe crampy pain. Nothing seemed to make this better. He tried a laxative as he had become constipated in the days prior without significant relief in his pain. He endorses a subjective fever, nausea, and emesis. No chills, cough, CP, SOB, urinary changes, or blood in his stools. He has a history of diverticulitis in the past. This episodes feels similar to those but is slightly more severe. In the past, he has managed his diverticulitis flare ups with CLD and rest. He believes this is his second episode in the last year but about the 8th episode in his lifetime. He is colonoscopy naive. No previous abdominal surgeries. Laboratory work up in the ED was relatively reassuring and he was without leukocytosis. CT Abdomen/Pelvis was concerned for acute sigmoid diverticulitis with question of microperforation.   Surgery is consulted by emergency medicine physician Dr. Antoine Primas, MD in this context for evaluation and management of acute diverticulitis.  PAST MEDICAL HISTORY (PMH):  Past Medical History:  Diagnosis Date  . Diverticulosis   . Tobacco abuse      PAST SURGICAL HISTORY (PSH):  History reviewed. No pertinent surgical history.   MEDICATIONS:  Prior to Admission medications   Not on File     ALLERGIES:  Allergies  Allergen Reactions  . Shellfish Allergy Nausea And Vomiting     SOCIAL HISTORY:  Social History   Socioeconomic History  . Marital status: Married    Spouse name: Not on file  . Number of children: Not on file  . Years of education:  Not on file  . Highest education level: Not on file  Occupational History  . Not on file  Tobacco Use  . Smoking status: Current Every Day Smoker  Substance and Sexual Activity  . Alcohol use: Yes  . Drug use: No  . Sexual activity: Not on file  Other Topics Concern  . Not on file  Social History Narrative  . Not on file   Social Determinants of Health   Financial Resource Strain:   . Difficulty of Paying Living Expenses: Not on file  Food Insecurity:   . Worried About Programme researcher, broadcasting/film/video in the Last Year: Not on file  . Ran Out of Food in the Last Year: Not on file  Transportation Needs:   . Lack of Transportation (Medical): Not on file  . Lack of Transportation (Non-Medical): Not on file  Physical Activity:   . Days of Exercise per Week: Not on file  . Minutes of Exercise per Session: Not on file  Stress:   . Feeling of Stress : Not on file  Social Connections:   . Frequency of Communication with Friends and Family: Not on file  . Frequency of Social Gatherings with Friends and Family: Not on file  . Attends Religious Services: Not on file  . Active Member of Clubs or Organizations: Not on file  . Attends Banker Meetings: Not on file  . Marital Status: Not on file  Intimate Partner Violence:   . Fear of Current or Ex-Partner: Not on file  . Emotionally Abused: Not on  file  . Physically Abused: Not on file  . Sexually Abused: Not on file     FAMILY HISTORY:  No family history on file.    REVIEW OF SYSTEMS:  Review of Systems  Constitutional: Positive for fever (Subjective). Negative for chills.  HENT: Negative for congestion and sore throat.   Respiratory: Negative for cough and shortness of breath.   Cardiovascular: Negative for chest pain and palpitations.  Gastrointestinal: Positive for abdominal pain, constipation, nausea and vomiting. Negative for blood in stool and diarrhea.  Genitourinary: Negative for dysuria and urgency.  All other  systems reviewed and are negative.   VITAL SIGNS:  Temp:  [97.9 F (36.6 C)] 97.9 F (36.6 C) (10/12 0802) Pulse Rate:  [71] 71 (10/12 0802) Resp:  [19] 19 (10/12 0802) BP: (118)/(78) 118/78 (10/12 0802) SpO2:  [97 %] 97 % (10/12 0802) Weight:  [77.1 kg] 77.1 kg (10/12 0801)     Height: 5\' 10"  (177.8 cm) Weight: 77.1 kg BMI (Calculated): 24.39   INTAKE/OUTPUT:  No intake/output data recorded.  PHYSICAL EXAM:  Physical Exam Vitals and nursing note reviewed. Exam conducted with a chaperone present.  Constitutional:      General: He is not in acute distress.    Appearance: Normal appearance. He is normal weight. He is not ill-appearing or toxic-appearing.     Comments: Patient appears uncomfortable  HENT:     Head: Normocephalic and atraumatic.     Mouth/Throat:     Mouth: Mucous membranes are moist.     Pharynx: Oropharynx is clear.  Eyes:     Conjunctiva/sclera: Conjunctivae normal.     Pupils: Pupils are equal, round, and reactive to light.  Cardiovascular:     Rate and Rhythm: Normal rate and regular rhythm.     Pulses: Normal pulses.     Heart sounds: No murmur heard.   Pulmonary:     Effort: Pulmonary effort is normal. No respiratory distress.     Comments: Taking short shallow breaths secondary to pain Abdominal:     General: Abdomen is flat. There is no distension.     Palpations: Abdomen is rigid.     Tenderness: There is abdominal tenderness in the right lower quadrant, suprapubic area and left lower quadrant. There is guarding (Voluntary). There is no rebound.     Comments: Patient is voluntary guarding, abdomen is firm, no appreciable distension, he seems most tender across his lower abdomen, no rebound, I do not think he is peritonitic   Genitourinary:    Comments: Deferred Musculoskeletal:     Right lower leg: No edema.     Left lower leg: No edema.  Skin:    General: Skin is warm and dry.     Coloration: Skin is not pale.     Findings: No erythema.   Neurological:     General: No focal deficit present.     Mental Status: He is alert and oriented to person, place, and time.  Psychiatric:        Mood and Affect: Mood normal.        Behavior: Behavior normal.      Labs:  CBC Latest Ref Rng & Units 02/15/2020 07/17/2015  WBC 4.0 - 10.5 K/uL 7.3 14.7(H)  Hemoglobin 13.0 - 17.0 g/dL 07/19/2015 49.7  Hematocrit 39 - 52 % 42.2 45.0  Platelets 150 - 400 K/uL 379 238   CMP Latest Ref Rng & Units 02/15/2020 07/17/2015  Glucose 70 - 99 mg/dL 07/19/2015) 378(H)  BUN  6 - 20 mg/dL 13 12  Creatinine 8.18 - 1.24 mg/dL 5.63 1.49  Sodium 702 - 145 mmol/L 138 138  Potassium 3.5 - 5.1 mmol/L 4.0 3.9  Chloride 98 - 111 mmol/L 102 106  CO2 22 - 32 mmol/L 25 25  Calcium 8.9 - 10.3 mg/dL 9.3 9.3  Total Protein 6.5 - 8.1 g/dL 8.1 8.0  Total Bilirubin 0.3 - 1.2 mg/dL 0.6 0.9  Alkaline Phos 38 - 126 U/L 79 65  AST 15 - 41 U/L 17 23  ALT 0 - 44 U/L 17 20     Imaging studies:   CT Abdomen/Pelvis (02/15/2020) personally reviewed showing sigmoid colonic diverticulitis, question of microperforation although not sure this is the case, no gross pneumoperitoneum, no abscess, and he does have a marked stool burden, and radiologist report reviewed:  IMPRESSION: 1. Acute sigmoid colon diverticulitis with evidence of microperforation. No definite abscess identified. 2. Small volume intraperitoneal free fluid. 3. Small hiatal hernia.   Assessment/Plan: (ICD-10's: K40.92) 44 y.o. male with lower abdominal pain found to have acute diverticulitis which is recurrent and the estimated 8th episode in his lifetime.   - Appreciate medicine admission  - Recommend NPO + IVF resuscitation  - Continue IV ABx (Zosyn)   - Once pain improved, he will certainly benefit from a bowel regimen  - Monitor abdominal examination  - Pain control prn; antiemetics prn  - Monitor for fever, leukocytosis  - No need for emergent surgical intervention. Given the recurrence of his  diverticulitis he will certainly benefit from discussion of elective sigmoid colectomy as an outpatient once through this acute episode.    - Further management per primary service; we will follow   All of the above findings and recommendations were discussed with the patient and his wife at bedside, and all of their questions were answered to their expressed satisfaction.  Thank you for the opportunity to participate in this patient's care.   -- Lynden Oxford, PA-C [facility] 02/15/2020, 11:24 AM [phone] M-F: 7am - 4pm

## 2020-02-16 DIAGNOSIS — K5792 Diverticulitis of intestine, part unspecified, without perforation or abscess without bleeding: Secondary | ICD-10-CM | POA: Diagnosis present

## 2020-02-16 DIAGNOSIS — D72829 Elevated white blood cell count, unspecified: Secondary | ICD-10-CM

## 2020-02-16 LAB — CBC
HCT: 40.6 % (ref 39.0–52.0)
Hemoglobin: 13.7 g/dL (ref 13.0–17.0)
MCH: 30.2 pg (ref 26.0–34.0)
MCHC: 33.7 g/dL (ref 30.0–36.0)
MCV: 89.4 fL (ref 80.0–100.0)
Platelets: 358 10*3/uL (ref 150–400)
RBC: 4.54 MIL/uL (ref 4.22–5.81)
RDW: 12.3 % (ref 11.5–15.5)
WBC: 17 10*3/uL — ABNORMAL HIGH (ref 4.0–10.5)
nRBC: 0 % (ref 0.0–0.2)

## 2020-02-16 LAB — BASIC METABOLIC PANEL
Anion gap: 11 (ref 5–15)
BUN: 14 mg/dL (ref 6–20)
CO2: 23 mmol/L (ref 22–32)
Calcium: 8.4 mg/dL — ABNORMAL LOW (ref 8.9–10.3)
Chloride: 103 mmol/L (ref 98–111)
Creatinine, Ser: 0.85 mg/dL (ref 0.61–1.24)
GFR, Estimated: >60 mL/min (ref >60)
Glucose, Bld: 113 mg/dL — ABNORMAL HIGH (ref 70–99)
Potassium: 4.1 mmol/L (ref 3.5–5.1)
Sodium: 137 mmol/L (ref 135–145)

## 2020-02-16 LAB — GLUCOSE, CAPILLARY: Glucose-Capillary: 112 mg/dL — ABNORMAL HIGH (ref 70–99)

## 2020-02-16 LAB — CULTURE, BLOOD (ROUTINE X 2): Culture: NO GROWTH

## 2020-02-16 LAB — HIV ANTIBODY (ROUTINE TESTING W REFLEX): HIV Screen 4th Generation wRfx: NONREACTIVE

## 2020-02-16 MED ORDER — MELATONIN 3 MG PO TABS
6.0000 mg | ORAL_TABLET | Freq: Every evening | ORAL | Status: DC | PRN
  Filled 2020-02-16: qty 2

## 2020-02-16 MED ORDER — MELATONIN 5 MG PO TABS
5.0000 mg | ORAL_TABLET | Freq: Every evening | ORAL | Status: DC | PRN
  Administered 2020-02-16: 5 mg via ORAL
  Filled 2020-02-16: qty 1

## 2020-02-16 NOTE — Progress Notes (Signed)
[facility] SURGICAL PROGRESS NOTE (cpt [phone])  Hospital Day(s): 0.   Interval History: Patient seen and examined, no acute events or new complaints overnight. Patient reports he is overall feeling better but still with pain in his suprapubic region and right abdomen, denies fever, chills, nausea, emesis. He did have a marked jump in his leukocytosis this morning to 17K. Renal function is normal, sCr - 0.85. No significant electrolyte derangements. Has been NPO  Review of Systems:  Constitutional: denies fever, chills  HEENT: denies cough or congestion  Respiratory: denies any shortness of breath  Cardiovascular: denies chest pain or palpitations  Gastrointestinal: + abdominal pain (improved), denied N/V, or diarrhea/and bowel function as per interval history Genitourinary: denies burning with urination or urinary frequency   Vital signs in last 24 hours: [min-max] current  Temp:  [97.9 F (36.6 C)-98.5 F (36.9 C)] 98.4 F (36.9 C) (10/13 0458) Pulse Rate:  [71-100] 81 (10/13 0458) Resp:  [17-19] 18 (10/13 0458) BP: (106-130)/(70-80) 117/78 (10/13 0458) SpO2:  [91 %-97 %] 97 % (10/13 0458) Weight:  [77.1 kg] 77.1 kg (10/12 0801)     Height: 5\' 10"  (177.8 cm) Weight: 77.1 kg BMI (Calculated): 24.39   Intake/Output last 2 shifts:  10/12 0701 - 10/13 0700 In: 2263.4 [I.V.:2163.4; IV Piggyback:100] Out: 300 [Urine:300]   Physical Exam:  Constitutional: alert, cooperative and no distress  HENT: normocephalic without obvious abnormality  Eyes: PERRL, EOM's grossly intact and symmetric  Respiratory: breathing non-labored at rest  Cardiovascular: regular rate and sinus rhythm  Gastrointestinal: Soft, still with pain in his suprapubic region and right abdomen which is improved from yesterday, non-distended, no rebound/guarding Musculoskeletal: No edema or wounds, motor and sensation grossly intact, NT    Labs:  CBC Latest Ref Rng & Units 02/16/2020 02/15/2020  07/17/2015  WBC 4.0 - 10.5 K/uL 17.0(H) 7.3 14.7(H)  Hemoglobin 13.0 - 17.0 g/dL 07/19/2015 93.7 90.2  Hematocrit 39 - 52 % 40.6 42.2 45.0  Platelets 150 - 400 K/uL 358 379 238   CMP Latest Ref Rng & Units 02/16/2020 02/15/2020 07/17/2015  Glucose 70 - 99 mg/dL 07/19/2015) 735(H) 299(M)  BUN 6 - 20 mg/dL 14 13 12   Creatinine 0.61 - 1.24 mg/dL 426(S 3.41  Sodium 135 - 145 mmol/L 137 138 138  Potassium 3.5 - 5.1 mmol/L 4.1 4.0 3.9  Chloride 98 - 111 mmol/L 103 102 106  CO2 22 - 32 mmol/L 23 25 25   Calcium 8.9 - 10.3 mg/dL 9.62) 9.3 9.3  Total Protein 6.5 - 8.1 g/dL - 8.1 8.0  Total Bilirubin 0.3 - 1.2 mg/dL - 0.6 0.9  Alkaline Phos 38 - 126 U/L - 79 65  AST 15 - 41 U/L - 17 23  ALT 0 - 44 U/L - 17 20     Imaging studies: No new pertinent imaging studies   Assessment/Plan: (ICD-10's: K31.92) 44 y.o. male with increase in leukocytosis, which may be delayed reaction from initial infection as he is otherwise clinically improved admitted with acute diverticulitis which is recurrent and the estimated 8th episode in his lifetime   - Advance to CLD  - Continue IVF resuscitation             - Continue IV ABx (Zosyn); Day 2/14             - Once pain improved, he will certainly benefit from a bowel regimen             - Monitor abdominal examination             -  Pain control prn; antiemetics prn             - Monitor for fever, leukocytosis             - No need for emergent surgical intervention. Given the recurrence of his diverticulitis he will certainly benefit from discussion of elective sigmoid colectomy as an outpatient once through this acute episode.               - Further management per primary service; we will follow   All of the above findings and recommendations were discussed with the patient, and the medical team, and all of patient's questions were answered to his expressed satisfaction.  -- Lynden Oxford, PA-C [facility] 02/16/2020, 7:13  AM [phone] M-F: 7am - 4pm

## 2020-02-16 NOTE — Progress Notes (Signed)
Patient ID: Louis Singh, male   DOB: March 24, 1976, 44 y.o.   MRN: [id number] [facility] PROGRESS NOTE  Louis Singh XNA:[id number] DOB: 12/07/1975 DOA: 02/15/2020 PCP: Marisue Ivan, MD  HPI/Subjective: Patient feeling a little bit better with regards to his abdominal pain.  Had a bowel movement so he feels better.  Able to tolerate liquids this morning.  This is happened for him a few times in the past.  Admitted with acute diverticulitis and microperforation.  Objective: Vitals:   02/16/20 0738 02/16/20 1126  BP: 120/77 122/81  Pulse: 79 94  Resp:  18  Temp: 98.2 F (36.8 C) 99.4 F (37.4 C)  SpO2: 96% 95%    Intake/Output Summary (Last 24 hours) at 02/16/2020 1359 Last data filed at 02/16/2020 0800 Gross per 24 hour  Intake 2263.39 ml  Output 450 ml  Net 1813.39 ml   Filed Weights   02/15/20 0801  Weight: 77.1 kg    ROS: Review of Systems  Respiratory: Negative for cough and shortness of breath.   Cardiovascular: Negative for chest pain.  Gastrointestinal: Positive for abdominal pain. Negative for nausea and vomiting.   Exam: Physical Exam HENT:     Head: Normocephalic.     Nose:     Left Turbinates: Not enlarged.     Mouth/Throat:     Pharynx: No oropharyngeal exudate.  Eyes:     General: Lids are normal.     Conjunctiva/sclera: Conjunctivae normal.     Pupils: Pupils are equal, round, and reactive to light.  Cardiovascular:     Rate and Rhythm: Normal rate and regular rhythm.     Heart sounds: Normal heart sounds, S1 normal and S2 normal.  Pulmonary:     Breath sounds: No decreased breath sounds, wheezing, rhonchi or rales.  Abdominal:     Palpations: Abdomen is soft.     Tenderness: There is abdominal tenderness in the right lower quadrant and left lower quadrant.  Musculoskeletal:     Right ankle: Swelling present.     Left ankle: Swelling present.  Skin:    General: Skin is warm.     Findings: No rash.  Neurological:     Mental Status:  He is alert and oriented to person, place, and time.       Data Reviewed: Basic Metabolic Panel: Recent Labs  Lab 02/15/20 0805 02/16/20 0425  NA 138 137  K 4.0 4.1  CL 102 103  CO2 25 23  GLUCOSE 138* 113*  BUN 13 14  CREATININE 0.98 0.85  CALCIUM 9.3 8.4*   Liver Function Tests: Recent Labs  Lab 02/15/20 0805  AST 17  ALT 17  ALKPHOS 79  BILITOT 0.6  PROT 8.1  ALBUMIN 3.8   Recent Labs  Lab 02/15/20 0805  LIPASE 24   CBC: Recent Labs  Lab 02/15/20 0805 02/16/20 0425  WBC 7.3 17.0*  HGB 14.0 13.7  HCT 42.2 40.6  MCV 90.9 89.4  PLT 379 358   CBG: Recent Labs  Lab 02/16/20 0753  GLUCAP 112*    Recent Results (from the past 240 hour(s))  Respiratory Panel by RT PCR (Flu A&B, Covid) - Nasopharyngeal Swab     Status: None   Collection Time: 02/15/20  3:16 PM   Specimen: Nasopharyngeal Swab  Result Value Ref Range Status   SARS Coronavirus 2 by RT PCR NEGATIVE NEGATIVE Final    Comment: (NOTE) SARS-CoV-2 target nucleic acids are NOT DETECTED.  The SARS-CoV-2 RNA is generally detectable in upper  respiratoy specimens during the acute phase of infection. The lowest concentration of SARS-CoV-2 viral copies this assay can detect is 131 copies/mL. A negative result does not preclude SARS-Cov-2 infection and should not be used as the sole basis for treatment or other patient management decisions. A negative result may occur with  improper specimen collection/handling, submission of specimen other than nasopharyngeal swab, presence of viral mutation(s) within the areas targeted by this assay, and inadequate number of viral copies (<131 copies/mL). A negative result must be combined with clinical observations, patient history, and epidemiological information. The expected result is Negative.  Fact Sheet for Patients:  https://www.moore.com/  Fact Sheet for Healthcare Providers:  https://www.young.biz/  This  test is no t yet approved or cleared by the [place] FDA and  has been authorized for detection and/or diagnosis of SARS-CoV-2 by FDA under an Emergency Use Authorization (EUA). This EUA will remain  in effect (meaning this test can be used) for the duration of the COVID-19 declaration under Section 564(b)(1) of the Act, 21 U.S.C. section 360bbb-3(b)(1), unless the authorization is terminated or revoked sooner.     Influenza A by PCR NEGATIVE NEGATIVE Final   Influenza B by PCR NEGATIVE NEGATIVE Final    Comment: (NOTE) The Xpert Xpress SARS-CoV-2/FLU/RSV assay is intended as an aid in  the diagnosis of influenza from Nasopharyngeal swab specimens and  should not be used as a sole basis for treatment. Nasal washings and  aspirates are unacceptable for Xpert Xpress SARS-CoV-2/FLU/RSV  testing.  Fact Sheet for Patients: https://www.moore.com/  Fact Sheet for Healthcare Providers: https://www.young.biz/  This test is not yet approved or cleared by the [place] FDA and  has been authorized for detection and/or diagnosis of SARS-CoV-2 by  FDA under an Emergency Use Authorization (EUA). This EUA will remain  in effect (meaning this test can be used) for the duration of the  Covid-19 declaration under Section 564(b)(1) of the Act, 21  U.S.C. section 360bbb-3(b)(1), unless the authorization is  terminated or revoked. Performed at [facility], [address] [place], [place] [address]   Culture, blood (Routine X 2) w Reflex to ID Panel     Status: None (Preliminary result)   Collection Time: 02/15/20  3:16 PM   Specimen: BLOOD  Result Value Ref Range Status   Specimen Description BLOOD RIGHT ANTECUBITAL  Final   Special Requests   Final    BOTTLES DRAWN AEROBIC AND ANAEROBIC Blood Culture results may not be optimal due to an inadequate volume of blood received in culture bottles   Culture   Final    NO GROWTH < 24  HOURS Performed at [facility], [address], [place], [place] [address]    Report Status PENDING  Incomplete  Culture, blood (Routine X 2) w Reflex to ID Panel     Status: None (Preliminary result)   Collection Time: 02/15/20  3:16 PM   Specimen: BLOOD  Result Value Ref Range Status   Specimen Description BLOOD LEFT ANTECUBITAL  Final   Special Requests   Final    BOTTLES DRAWN AEROBIC AND ANAEROBIC Blood Culture results may not be optimal due to an inadequate volume of blood received in culture bottles   Culture   Final    NO GROWTH < 24 HOURS Performed at [facility], [address], [place], [place] [address]    Report Status PENDING  Incomplete     Studies: CT ABDOMEN PELVIS W CONTRAST  Result Date: 02/15/2020  CLINICAL DATA:  Right lower quadrant pain. History of diverticulitis. EXAM: CT ABDOMEN AND PELVIS WITH CONTRAST TECHNIQUE: Multidetector CT imaging of the abdomen and pelvis was performed using the standard protocol following bolus administration of intravenous contrast. CONTRAST:  OMNIPAQUE IOHEXOL 300 MG/ML  SOLN COMPARISON:  06/05/2008 FINDINGS: Lower chest: Clear lung bases. Hepatobiliary: No focal liver abnormality is seen. No gallstones, gallbladder wall thickening, or biliary dilatation. Pancreas: Unremarkable. Spleen: Unremarkable. Adrenals/Urinary Tract: Unremarkable adrenal glands. No evidence of renal mass, calculi, or hydronephrosis. Unremarkable bladder. Stomach/Bowel: There is a small sliding hiatal hernia. There is a moderate amount of stool in the ascending and transverse colon without evidence of bowel obstruction. There is diverticulosis of the sigmoid colon, and there is moderate sigmoid colon wall thickening with prominent surrounding inflammation. Prominent enhancement of adjacent nondilated small bowel loops likely reflects secondary involvement by the primary colonic process. There are are a few small foci of extraluminal gas in  the pelvis/lower abdomen. No definite fluid collection is identified, however assessment is limited by absence of oral contrast as there are multiple fluid-filled small bowel loops in the pelvis. Vascular/Lymphatic: Normal caliber of the abdominal aorta. Retroaortic left renal vein. Prominent number of small para-aortic lymph nodes measuring up to 6 mm in short axis, likely reactive. Reproductive: Unremarkable prostate. Other: Small volume intraperitoneal free fluid. Musculoskeletal: No acute osseous abnormality or suspicious osseous lesion. IMPRESSION: 1. Acute sigmoid colon diverticulitis with evidence of microperforation. No definite abscess identified. 2. Small volume intraperitoneal free fluid. 3. Small hiatal hernia. Electronically Signed   By: Sebastian Ache M.D.   On: 02/15/2020 10:11    Scheduled Meds: . heparin  5,000 Units Subcutaneous Q8H  . ketorolac  30 mg Intravenous Q6H  . nicotine  21 mg Transdermal Daily  . pantoprazole (PROTONIX) IV  40 mg Intravenous Q24H   Continuous Infusions: . sodium chloride 125 mL/hr at 02/16/20 1051  . piperacillin-tazobactam (ZOSYN)  IV 3.375 g (02/16/20 1052)    Assessment/Plan: 1. Acute sigmoid diverticulitis with microperforation.  Patient states that he has a lot of pain in his right lower quadrant.  White blood cell count elevated today.  Appreciate general surgery follow-up.  Advance to clear liquid diet today.  Continue IV Zosyn. 2. Tobacco abuse.  Nicotine patch ordered        Code Status:     Code Status Orders  (From admission, onward)         Start     Ordered   02/15/20 1944  Full code  Continuous        02/15/20 1943        Code Status History    This patient has a current code status but no historical code status.   Advance Care Planning Activity     Family Communication: Spoke with wife on the phone Disposition Plan: Status is: Inpatient  Dispo: The patient is from: Home              Anticipated d/c is to: Home               Anticipated d/c date is: Potential in the afternoon on 02/17/2020 versus 02/18/2020              Patient currently being treated for acute diverticulitis with microperforation.  Followed by general surgery.  On clear liquid diet today.  Patient must be able to tolerate oral antibiotics and solid food prior to disposition.  Consultants:  General surgery  Time spent: 27-minute  [place]  [facility] [employer]

## 2020-02-17 ENCOUNTER — Encounter: Payer: Self-pay | Admitting: Internal Medicine

## 2020-02-17 ENCOUNTER — Encounter
Admission: EM | Disposition: A | Discharge status: Home / Self Care | Payer: Self-pay | Source: Home / Self Care | Attending: Surgery

## 2020-02-17 ENCOUNTER — Inpatient Hospital Stay: Payer: Self-pay

## 2020-02-17 ENCOUNTER — Inpatient Hospital Stay: Payer: Self-pay | Admitting: Anesthesiology

## 2020-02-17 DIAGNOSIS — K5720 Diverticulitis of large intestine with perforation and abscess without bleeding: Principal | ICD-10-CM

## 2020-02-17 HISTORY — PX: COLECTOMY WITH COLOSTOMY CREATION/HARTMANN PROCEDURE: SHX6598

## 2020-02-17 LAB — CBC
HCT: 36.2 % — ABNORMAL LOW (ref 39.0–52.0)
Hemoglobin: 12.5 g/dL — ABNORMAL LOW (ref 13.0–17.0)
MCH: 31.1 pg (ref 26.0–34.0)
MCHC: 34.5 g/dL (ref 30.0–36.0)
MCV: 90 fL (ref 80.0–100.0)
Platelets: 297 10*3/uL (ref 150–400)
RBC: 4.02 MIL/uL — ABNORMAL LOW (ref 4.22–5.81)
RDW: 12.4 % (ref 11.5–15.5)
WBC: 19 10*3/uL — ABNORMAL HIGH (ref 4.0–10.5)
nRBC: 0 % (ref 0.0–0.2)

## 2020-02-17 LAB — GLUCOSE, CAPILLARY: Glucose-Capillary: 108 mg/dL — ABNORMAL HIGH (ref 70–99)

## 2020-02-17 SURGERY — COLECTOMY, WITH COLOSTOMY CREATION
Anesthesia: General

## 2020-02-17 MED ORDER — FENTANYL CITRATE (PF) 100 MCG/2ML IJ SOLN
INTRAMUSCULAR | Status: DC | PRN
Start: 2020-02-17 — End: 2020-02-17
  Administered 2020-02-17: 50 ug via INTRAVENOUS
  Administered 2020-02-17: 100 ug via INTRAVENOUS
  Administered 2020-02-17 (×3): 50 ug via INTRAVENOUS
  Administered 2020-02-17 (×2): 25 ug via INTRAVENOUS

## 2020-02-17 MED ORDER — ROCURONIUM BROMIDE 100 MG/10ML IV SOLN
INTRAVENOUS | Status: DC | PRN
  Administered 2020-02-17: 30 mg via INTRAVENOUS
  Administered 2020-02-17: 100 mg via INTRAVENOUS
  Administered 2020-02-17 (×2): 20 mg via INTRAVENOUS

## 2020-02-17 MED ORDER — ONDANSETRON HCL 4 MG/2ML IJ SOLN: 4.0000 mg | Freq: Four times a day (QID) | INTRAMUSCULAR | Status: DC | PRN

## 2020-02-17 MED ORDER — DEXMEDETOMIDINE (PRECEDEX) IN NS 20 MCG/5ML (4 MCG/ML) IV SYRINGE
PREFILLED_SYRINGE | INTRAVENOUS | Status: AC
  Filled 2020-02-17: qty 5

## 2020-02-17 MED ORDER — FENTANYL CITRATE (PF) 250 MCG/5ML IJ SOLN
INTRAMUSCULAR | Status: AC
  Filled 2020-02-17: qty 5

## 2020-02-17 MED ORDER — SUCCINYLCHOLINE CHLORIDE 200 MG/10ML IV SOSY
PREFILLED_SYRINGE | INTRAVENOUS | Status: AC
  Filled 2020-02-17: qty 10

## 2020-02-17 MED ORDER — OXYCODONE HCL 5 MG PO TABS: 5.0000 mg | ORAL_TABLET | Freq: Once | ORAL | Status: DC | PRN

## 2020-02-17 MED ORDER — MIDAZOLAM HCL 2 MG/2ML IJ SOLN
INTRAMUSCULAR | Status: DC | PRN
  Administered 2020-02-17: 2 mg via INTRAVENOUS

## 2020-02-17 MED ORDER — BUPIVACAINE LIPOSOME 1.3 % IJ SUSP
INTRAMUSCULAR | Status: AC
  Filled 2020-02-17: qty 20

## 2020-02-17 MED ORDER — LACTATED RINGERS IV SOLN: INTRAVENOUS | Status: DC | PRN

## 2020-02-17 MED ORDER — LIDOCAINE HCL (PF) 2 % IJ SOLN
INTRAMUSCULAR | Status: DC | PRN
  Administered 2020-02-17: 1 mg/kg/h

## 2020-02-17 MED ORDER — ACETAMINOPHEN 10 MG/ML IV SOLN
INTRAVENOUS | Status: AC
  Filled 2020-02-17: qty 100

## 2020-02-17 MED ORDER — KETOROLAC TROMETHAMINE 30 MG/ML IJ SOLN
INTRAMUSCULAR | Status: DC | PRN
  Administered 2020-02-17: 30 mg via INTRAVENOUS

## 2020-02-17 MED ORDER — LIDOCAINE HCL (PF) 2 % IJ SOLN: INTRAMUSCULAR | Status: DC | PRN

## 2020-02-17 MED ORDER — PROPOFOL 10 MG/ML IV BOLUS
INTRAVENOUS | Status: DC | PRN
  Administered 2020-02-17: 50 mg via INTRAVENOUS
  Administered 2020-02-17: 150 mg via INTRAVENOUS

## 2020-02-17 MED ORDER — LACTATED RINGERS IV SOLN: INTRAVENOUS | Status: DC

## 2020-02-17 MED ORDER — ONDANSETRON HCL 4 MG/2ML IJ SOLN
INTRAMUSCULAR | Status: AC
  Filled 2020-02-17: qty 2

## 2020-02-17 MED ORDER — PROPOFOL 10 MG/ML IV BOLUS
INTRAVENOUS | Status: AC
  Filled 2020-02-17: qty 20

## 2020-02-17 MED ORDER — DEXAMETHASONE SODIUM PHOSPHATE 10 MG/ML IJ SOLN
INTRAMUSCULAR | Status: DC | PRN
  Administered 2020-02-17: 10 mg via INTRAVENOUS

## 2020-02-17 MED ORDER — DEXMEDETOMIDINE (PRECEDEX) IN NS 20 MCG/5ML (4 MCG/ML) IV SYRINGE
PREFILLED_SYRINGE | INTRAVENOUS | Status: DC | PRN
  Administered 2020-02-17 (×2): 8 ug via INTRAVENOUS
  Administered 2020-02-17 (×2): 12 ug via INTRAVENOUS

## 2020-02-17 MED ORDER — SODIUM CHLORIDE 0.9 % IV SOLN
INTRAVENOUS | Status: DC | PRN
  Administered 2020-02-17: 50 mL

## 2020-02-17 MED ORDER — ROCURONIUM BROMIDE 10 MG/ML (PF) SYRINGE
PREFILLED_SYRINGE | INTRAVENOUS | Status: AC
  Filled 2020-02-17: qty 10

## 2020-02-17 MED ORDER — BUPIVACAINE-EPINEPHRINE (PF) 0.25% -1:200000 IJ SOLN
INTRAMUSCULAR | Status: AC
  Filled 2020-02-17: qty 30

## 2020-02-17 MED ORDER — PROPOFOL 500 MG/50ML IV EMUL
INTRAVENOUS | Status: AC
  Filled 2020-02-17: qty 50

## 2020-02-17 MED ORDER — LIDOCAINE HCL (PF) 2 % IJ SOLN
INTRAMUSCULAR | Status: AC
  Filled 2020-02-17: qty 15

## 2020-02-17 MED ORDER — ENOXAPARIN SODIUM 40 MG/0.4ML ~~LOC~~ SOLN
40.0000 mg | SUBCUTANEOUS | Status: DC
  Administered 2020-02-18 – 2020-02-22 (×5): 40 mg via SUBCUTANEOUS
  Filled 2020-02-17 (×5): qty 0.4

## 2020-02-17 MED ORDER — SODIUM CHLORIDE FLUSH 0.9 % IV SOLN
INTRAVENOUS | Status: AC
  Filled 2020-02-17: qty 40

## 2020-02-17 MED ORDER — PROPOFOL 500 MG/50ML IV EMUL
INTRAVENOUS | Status: DC | PRN
  Administered 2020-02-17: 150 ug/kg/min via INTRAVENOUS

## 2020-02-17 MED ORDER — IOHEXOL 9 MG/ML PO SOLN
500.0000 mL | ORAL | Status: AC
  Administered 2020-02-17 (×2): 500 mL via ORAL

## 2020-02-17 MED ORDER — LIDOCAINE HCL (CARDIAC) PF 100 MG/5ML IV SOSY
PREFILLED_SYRINGE | INTRAVENOUS | Status: DC | PRN
  Administered 2020-02-17: 100 mg via INTRAVENOUS

## 2020-02-17 MED ORDER — ONDANSETRON HCL 4 MG/2ML IJ SOLN
INTRAMUSCULAR | Status: DC | PRN
  Administered 2020-02-17: 4 mg via INTRAVENOUS

## 2020-02-17 MED ORDER — SUGAMMADEX SODIUM 200 MG/2ML IV SOLN
INTRAVENOUS | Status: DC | PRN
  Administered 2020-02-17: 150 mg via INTRAVENOUS

## 2020-02-17 MED ORDER — MIDAZOLAM HCL 2 MG/2ML IJ SOLN
INTRAMUSCULAR | Status: AC
  Filled 2020-02-17: qty 2

## 2020-02-17 MED ORDER — LIDOCAINE HCL (PF) 2 % IJ SOLN
INTRAMUSCULAR | Status: AC
  Filled 2020-02-17: qty 5

## 2020-02-17 MED ORDER — ACETAMINOPHEN 10 MG/ML IV SOLN: 1000.0000 mg | Freq: Once | INTRAVENOUS | Status: DC | PRN

## 2020-02-17 MED ORDER — OXYCODONE HCL 5 MG/5ML PO SOLN: 5.0000 mg | Freq: Once | ORAL | Status: DC | PRN

## 2020-02-17 MED ORDER — FENTANYL CITRATE (PF) 100 MCG/2ML IJ SOLN
INTRAMUSCULAR | Status: AC
  Filled 2020-02-17: qty 2

## 2020-02-17 MED ORDER — CHLORHEXIDINE GLUCONATE CLOTH 2 % EX PADS: 6.0000 | MEDICATED_PAD | Freq: Every day | CUTANEOUS | Status: DC

## 2020-02-17 MED ORDER — ACETAMINOPHEN 10 MG/ML IV SOLN
INTRAVENOUS | Status: DC | PRN
  Administered 2020-02-17: 1000 mg via INTRAVENOUS

## 2020-02-17 MED ORDER — FENTANYL CITRATE (PF) 100 MCG/2ML IJ SOLN: 25.0000 ug | INTRAMUSCULAR | Status: DC | PRN

## 2020-02-17 MED ORDER — ONDANSETRON HCL 4 MG/2ML IJ SOLN: 4.0000 mg | Freq: Once | INTRAMUSCULAR | Status: DC | PRN

## 2020-02-17 MED ORDER — DEXAMETHASONE SODIUM PHOSPHATE 10 MG/ML IJ SOLN
INTRAMUSCULAR | Status: AC
  Filled 2020-02-17: qty 1

## 2020-02-17 MED ORDER — KETOROLAC TROMETHAMINE 30 MG/ML IJ SOLN
INTRAMUSCULAR | Status: AC
  Filled 2020-02-17: qty 1

## 2020-02-17 MED ORDER — IOHEXOL 300 MG/ML  SOLN
100.0000 mL | Freq: Once | INTRAMUSCULAR | Status: AC | PRN
  Administered 2020-02-17: 100 mL via INTRAVENOUS

## 2020-02-17 MED ORDER — BUPIVACAINE-EPINEPHRINE (PF) 0.25% -1:200000 IJ SOLN
INTRAMUSCULAR | Status: DC | PRN
  Administered 2020-02-17: 30 mL via PERINEURAL

## 2020-02-17 MED ORDER — PHENYLEPHRINE HCL (PRESSORS) 10 MG/ML IV SOLN
INTRAVENOUS | Status: AC
  Filled 2020-02-17: qty 1

## 2020-02-17 SURGICAL SUPPLY — 48 items
APPLICATOR CHLORAPREP 10.5 ORG (MISCELLANEOUS) ×3 IMPLANT
BULB RESERV EVAC DRAIN JP 100C (MISCELLANEOUS) ×3 IMPLANT
CANISTER SUCT 1200ML W/VALVE (MISCELLANEOUS) IMPLANT
COVER WAND RF STERILE (DRAPES) ×3 IMPLANT
DRAIN CHANNEL JP 19F (MISCELLANEOUS) ×3 IMPLANT
DRAPE LAPAROTOMY 100X77 ABD (DRAPES) ×3 IMPLANT
DRSG OPSITE POSTOP 4X12 (GAUZE/BANDAGES/DRESSINGS) ×3 IMPLANT
DRSG OPSITE POSTOP 4X14 (GAUZE/BANDAGES/DRESSINGS) IMPLANT
DRSG TEGADERM 4X10 (GAUZE/BANDAGES/DRESSINGS) ×3 IMPLANT
DRSG TEGADERM 4X4.75 (GAUZE/BANDAGES/DRESSINGS) ×3 IMPLANT
ELECT CAUTERY BLADE 6.4 (BLADE) ×3 IMPLANT
ELECT REM PT RETURN 9FT ADLT (ELECTROSURGICAL) ×3
ELECTRODE REM PT RTRN 9FT ADLT (ELECTROSURGICAL) ×1 IMPLANT
GAUZE SPONGE 4X4 12PLY STRL (GAUZE/BANDAGES/DRESSINGS) ×6 IMPLANT
GLOVE SURG SYN 7.0 (GLOVE) ×12 IMPLANT
GLOVE SURG SYN 7.5  E (GLOVE) ×8
GLOVE SURG SYN 7.5 E (GLOVE) ×4 IMPLANT
GOWN STRL REUS W/ TWL LRG LVL3 (GOWN DISPOSABLE) ×4 IMPLANT
GOWN STRL REUS W/TWL LRG LVL3 (GOWN DISPOSABLE) ×8
KIT OSTOMY 2 PC DRNBL 2.25 STR (WOUND CARE) ×1 IMPLANT
KIT OSTOMY DRAINABLE 2.25 STR (WOUND CARE) ×2
LABEL OR SOLS (LABEL) ×3 IMPLANT
LIGASURE IMPACT 36 18CM CVD LR (INSTRUMENTS) ×3 IMPLANT
MANIFOLD NEPTUNE II (INSTRUMENTS) ×3 IMPLANT
NEEDLE HYPO 22GX1.5 SAFETY (NEEDLE) ×3 IMPLANT
NS IRRIG 1000ML POUR BTL (IV SOLUTION) ×9 IMPLANT
NS IRRIG 500ML POUR BTL (IV SOLUTION) ×3 IMPLANT
PACK BASIN MAJOR ARMC (MISCELLANEOUS) ×3 IMPLANT
PACK COLON CLEAN CLOSURE (MISCELLANEOUS) ×6 IMPLANT
SEPRAFILM MEMBRANE 5X6 (MISCELLANEOUS) IMPLANT
SET YANKAUER POOLE SUCT (MISCELLANEOUS) ×3 IMPLANT
SPONGE LAP 18X18 RF (DISPOSABLE) ×6 IMPLANT
STAPLER CUT CVD 40MM GREEN (STAPLE) ×3 IMPLANT
STAPLER CUT RELOAD GREEN (STAPLE) ×3 IMPLANT
STAPLER SKIN PROX 35W (STAPLE) ×3 IMPLANT
SUT PDS AB 1 CT1 36 (SUTURE) ×3 IMPLANT
SUT PROLENE 2 0 SH DA (SUTURE) IMPLANT
SUT SILK 2 0 (SUTURE) ×2
SUT SILK 2-0 18XBRD TIE 12 (SUTURE) ×1 IMPLANT
SUT SILK 3-0 (SUTURE) ×3 IMPLANT
SUT VIC AB 3-0 SH 27 (SUTURE)
SUT VIC AB 3-0 SH 27X BRD (SUTURE) IMPLANT
SYR 10ML LL (SYRINGE) ×3 IMPLANT
SYR BULB IRRIG 60ML STRL (SYRINGE) ×3 IMPLANT
SYSTEM UROSTOMY GENTLE TOUCH (WOUND CARE) IMPLANT
TRAY FOLEY MTR SLVR 16FR STAT (SET/KITS/TRAYS/PACK) ×3 IMPLANT
TUBING CONNECTING 10 (TUBING) ×2 IMPLANT
TUBING CONNECTING 10' (TUBING) ×1

## 2020-02-17 NOTE — Progress Notes (Signed)
[facility] SURGICAL PROGRESS NOTE (cpt [phone])  Hospital Day(s): 1.   Interval History:  Patient seen and examined No acute events or new complaints overnight.  Patient reports he still has some abdominal pain, this is relatively unchanged He doe s report new distension  He denies nausea, emesis, fever, chills. Leukocytosis did slightly worsen this morning, now up to 19.0K He is having good UO, 550 ccs + unmeasured He was started on CLD yesterday (10/13) tolerating well; + BM Mobilizing  Review of Systems:  Constitutional: denies fever, chills  HEENT: denies cough or congestion  Respiratory: denies any shortness of breath  Cardiovascular: denies chest pain or palpitations  Gastrointestinal: + abdominal pain, + distension, denied N/V, or diarrhea/and bowel function as per interval history Genitourinary: denies burning with urination or urinary frequency  Vital signs in last 24 hours: [min-max] current  Temp:  [98.2 F (36.8 C)-99.4 F (37.4 C)] 98.4 F (36.9 C) (10/14 0402) Pulse Rate:  [79-98] 94 (10/14 0402) Resp:  [17-19] 17 (10/14 0402) BP: (111-122)/(77-86) 111/83 (10/14 0402) SpO2:  [94 %-96 %] 96 % (10/14 0402)     Height: 5\' 10"  (177.8 cm) Weight: 77.1 kg BMI (Calculated): 24.39   Intake/Output last 2 shifts:  10/13 0701 - 10/14 0700 In: 2018.2 [P.O.:360; I.V.:1513.3; IV Piggyback:144.9] Out: 550 [Urine:550]   Physical Exam:  Constitutional: alert, cooperative and no distress  HENT: normocephalic without obvious abnormality  Eyes: PERRL, EOM's grossly intact and symmetric  Respiratory: breathing non-labored at rest  Cardiovascular: regular rate and sinus rhythm  Gastrointestinal: Soft, still with pain in his suprapubic region and right abdomen which is improved from yesterday, non-distended, no rebound/guarding Musculoskeletal: No edema or wounds, motor and sensation grossly intact, NT   Labs:  CBC Latest Ref Rng & Units 02/17/2020 02/16/2020  02/15/2020  WBC 4.0 - 10.5 K/uL 19.0(H) 17.0(H) 7.3  Hemoglobin 13.0 - 17.0 g/dL 12.5(L) 13.7 14.0  Hematocrit 39 - 52 % 36.2(L) 40.6 42.2  Platelets 150 - 400 K/uL 297 358 379   CMP Latest Ref Rng & Units 02/16/2020 02/15/2020 07/17/2015  Glucose 70 - 99 mg/dL 07/19/2015) 737(T) 062(I)  BUN 6 - 20 mg/dL 14 13 12   Creatinine 0.61 - 1.24 mg/dL 948(N 4.62  Sodium 135 - 145 mmol/L 137 138 138  Potassium 3.5 - 5.1 mmol/L 4.1 4.0 3.9  Chloride 98 - 111 mmol/L 103 102 106  CO2 22 - 32 mmol/L 23 25 25   Calcium 8.9 - 10.3 mg/dL 7.03) 9.3 9.3  Total Protein 6.5 - 8.1 g/dL - 8.1 8.0  Total Bilirubin 0.3 - 1.2 mg/dL - 0.6 0.9  Alkaline Phos 38 - 126 U/L - 79 65  AST 15 - 41 U/L - 17 23  ALT 0 - 44 U/L - 17 20     Imaging studies: No new pertinent imaging studies   Assessment/Plan: (ICD-10's: K9.92) 44 y.o. male with increase in leukocytosis but otherwise clinically improved admitted with acute diverticulitis which is recurrent and the estimated 8th episode in his lifetime   - We will repeat CT Abdomen/Pelvis this morning given increased leukocytosis to evaluate for intra-abdominal abscess or wosening diverticulitis.    - continue CLD Diet + IVF  - Continue IV ABx (Zosyn); Day 3/14 - Once pain improved, he will certainly benefit from a bowel regimen - Monitor abdominal examination - Pain control prn; antiemetics prn - Monitor for fever, leukocytosis - No need for emergent surgical intervention. Given the recurrence of his diverticulitis he will  certainly benefit from discussion of elective sigmoid colectomy as an outpatient once through this acute episode. Should he fail conservative measures or clinically deteriorate, he would warrant surgical intervention sooner and likely temporizing colostomy.  - Further management per primary service; we will follow  All of the above findings and recommendations were discussed  with the patient, and the medical team, and all of patient's questions were answered to his expressed satisfaction.  -- Lynden Oxford, PA-C [facility] 02/17/2020, 7:15 AM [phone] M-F: 7am - 4pm

## 2020-02-17 NOTE — Anesthesia Procedure Notes (Signed)
Procedure Name: Intubation Date/Time: 02/17/2020 4:22 PM Performed by: Aline Brochure, CRNA Pre-anesthesia Checklist: Patient identified, Emergency Drugs available, Suction available and Patient being monitored Patient Re-evaluated:Patient Re-evaluated prior to induction Oxygen Delivery Method: Circle system utilized Preoxygenation: Pre-oxygenation with 100% oxygen Induction Type: IV induction Ventilation: Mask ventilation without difficulty Laryngoscope Size: Mac and 4 Grade View: Grade I Tube type: Oral Tube size: 7.0 mm Number of attempts: 1 Airway Equipment and Method: Stylet Placement Confirmation: ETT inserted through vocal cords under direct vision,  positive ETCO2 and breath sounds checked- equal and bilateral Secured at: 24 cm Tube secured with: Tape Dental Injury: Teeth and Oropharynx as per pre-operative assessment

## 2020-02-17 NOTE — Anesthesia Postprocedure Evaluation (Signed)
Anesthesia Post Note  Patient: Louis Singh  Procedure(s) Performed: COLECTOMY WITH COLOSTOMY CREATION/HARTMANN PROCEDURE (N/A )  Patient location during evaluation: PACU Anesthesia Type: General Level of consciousness: awake and alert Pain management: pain level controlled Vital Signs Assessment: post-procedure vital signs reviewed and stable Respiratory status: spontaneous breathing, nonlabored ventilation, respiratory function stable and patient connected to nasal cannula oxygen Cardiovascular status: blood pressure returned to baseline and stable Postop Assessment: no apparent nausea or vomiting Anesthetic complications: no   No complications documented.   Last Vitals:  Vitals:   02/17/20 2112 02/17/20 2143  BP: 104/73 103/71  Pulse: 79 77  Resp: 18 17  Temp: 36.4 C 36.7 C  SpO2: 92% 94%    Last Pain:  Vitals:   02/17/20 2143  TempSrc: Oral  PainSc:                  [name]

## 2020-02-17 NOTE — Op Note (Addendum)
Procedure Date:  02/17/2020  Pre-operative Diagnosis:  Perforated feculent diverticulitis  Post-operative Diagnosis:  Perforated feculent diverticulitis  Procedure:  Exploratory Laparotomy and Hartmann's procedure  Surgeon:  [name], MD  Assistants:  Hulda Marin, MD (due to complexity of case, for exposure, resection), Lynden Oxford, PA-C  Anesthesia:  General endotracheal  Estimated Blood Loss:  100 ml  Specimens:  Sigmoid colon  Complications:  None  Findings:  The patient had evidence of feculent peritonitis, with fecal spillage in the low abdomen and significant inflammation of the sigmoid colon and reactive inflammation of the surrounding loops of small bowel.  Indications for Procedure:  This is a 44 y.o. male admitted with acute diverticulitis on 10/12 with worsening clinical picture today.  CT scan revealed pneumoperitoneum and stool spillage.  The risks of bleeding, abscess or infection, injury to surrounding structures, and need for further procedures were all discussed with the patient and was willing to proceed.  Description of Procedure: The patient was correctly identified in the preoperative area and brought into the operating room.  The patient was placed supine with VTE prophylaxis in place.  Appropriate time-outs were performed.  Anesthesia was induced and the patient was intubated.  Foley catheter was placed.  Appropriate antibiotics were infused.  The abdomen was prepped and draped in a sterile fashion.  A midline incision was made and electrocautery was used to dissect down the subcutaneous tissue to the fascia.  The fascia was incised and extended superiorly and inferiorly.  The abdomen was explored revealing feculent peritonitis in the pelvis.  There was significant inflammatory adhesions between loops of small bowel, between the colon and the abdominal wall, and colon and pelvic wall.  These were all lysed bluntly.  The sigmoid colon was identified and  it was significantly inflamed and thickened at its distal portion.  This was mobilized proximally and distally.  In doing so, the left ureter was identified and preserved.  Proximally, it was stapled off using contour green load at the junction between sigmoid and descending colon.  Distally, it was stapled off using contour green load as well at the rectosigmoid junction.  LigaSure was used to cauterize through the mesentery of the specimen.  The specimen was sent off to pathology.  The abdominal cavity was then thoroughly irrigated with warm saline in all quadrants.  A 19 Fr. Blake drain was inserted via the RLQ to the pelvis.  A circular skin incision was made for the ostomy and cautery was used to resect the underlying subcutaneous tissue.  A cruciate incision was made through the anterior and posterior sheaths, separating the rectus muscle in between.  This was wide enough to pass two fingers without significant tightness.  The distal descending colon was passed through.  We then proceeded to rescrub for our clean closure.  80 ml of Exparel solution combined with 0.25% bupivacaine with epi and saline were infiltrated onto the peritoneum, fascia, and subcutaneous tissue.  The midline was then closed with #1 PDS sutures.  The incision was irrigated and closed with staples.  We then proceeded to mature the end colostomy in standard fashion.  After this, the skin was cleaned, Honeycomb dressing was applied to the midline incision, drain was secured to skin with 3-0 Nylon and dressed with 4x4 gauze and Tegaderm, and the ostomy appliance was placed without issues.    The patient was emerged from anesthesia and extubated and brought to the recovery room for further management.  The patient tolerated the  procedure well and all counts were correct at the end of the case.   [name], MD

## 2020-02-17 NOTE — Progress Notes (Signed)
Patient ID: Louis Singh, male   DOB: Dec 24, 1975, 44 y.o.   MRN: [id number] [facility] PROGRESS NOTE  Jahel Wavra LOV:[id number] DOB: Aug 25, 1975 DOA: 02/15/2020 PCP: Marisue Ivan, MD  HPI/Subjective: Patient had a lot of gas this morning when I saw him.  Still having some abdominal pain.  No nausea or vomiting.  Doing okay with the liquids.  Objective: Vitals:   02/17/20 1103 02/17/20 1110  BP: 130/80 130/80  Pulse: 92 92  Resp: 20 20  Temp: 99.3 F (37.4 C) 99.3 F (37.4 C)  SpO2: 94% 94%    Intake/Output Summary (Last 24 hours) at 02/17/2020 1152 Last data filed at 02/17/2020 0508 Gross per 24 hour  Intake 2018.15 ml  Output 400 ml  Net 1618.15 ml   Filed Weights   02/15/20 0801  Weight: 77.1 kg    ROS: Review of Systems  Respiratory: Negative for shortness of breath.   Cardiovascular: Negative for chest pain.  Gastrointestinal: Positive for abdominal pain. Negative for nausea and vomiting.   Exam: Physical Exam HENT:     Head: Normocephalic.     Mouth/Throat:     Pharynx: No oropharyngeal exudate.  Eyes:     General: Lids are normal.     Conjunctiva/sclera: Conjunctivae normal.     Pupils: Pupils are equal, round, and reactive to light.  Cardiovascular:     Rate and Rhythm: Normal rate and regular rhythm.     Heart sounds: Normal heart sounds, S1 normal and S2 normal.  Pulmonary:     Breath sounds: Examination of the right-lower field reveals decreased breath sounds. Examination of the left-lower field reveals decreased breath sounds. Decreased breath sounds present. No wheezing, rhonchi or rales.  Abdominal:     Palpations: Abdomen is soft.     Tenderness: There is abdominal tenderness in the right lower quadrant and left lower quadrant.  Musculoskeletal:     Right lower leg: No swelling.     Left lower leg: No swelling.  Skin:    General: Skin is warm.     Findings: No rash.  Neurological:     Mental Status: He is alert and oriented to  person, place, and time.       Data Reviewed: Basic Metabolic Panel: Recent Labs  Lab 02/15/20 0805 02/16/20 0425  NA 138 137  K 4.0 4.1  CL 102 103  CO2 25 23  GLUCOSE 138* 113*  BUN 13 14  CREATININE 0.98 0.85  CALCIUM 9.3 8.4*   Liver Function Tests: Recent Labs  Lab 02/15/20 0805  AST 17  ALT 17  ALKPHOS 79  BILITOT 0.6  PROT 8.1  ALBUMIN 3.8   Recent Labs  Lab 02/15/20 0805  LIPASE 24   CBC: Recent Labs  Lab 02/15/20 0805 02/16/20 0425 02/17/20 0349  WBC 7.3 17.0* 19.0*  HGB 14.0 13.7 12.5*  HCT 42.2 40.6 36.2*  MCV 90.9 89.4 90.0  PLT 379 358 297    CBG: Recent Labs  Lab 02/16/20 0753 02/17/20 0831  GLUCAP 112* 108*    Recent Results (from the past 240 hour(s))  Respiratory Panel by RT PCR (Flu A&B, Covid) - Nasopharyngeal Swab     Status: None   Collection Time: 02/15/20  3:16 PM   Specimen: Nasopharyngeal Swab  Result Value Ref Range Status   SARS Coronavirus 2 by RT PCR NEGATIVE NEGATIVE Final    Comment: (NOTE) SARS-CoV-2 target nucleic acids are NOT DETECTED.  The SARS-CoV-2 RNA is generally detectable in upper respiratoy  specimens during the acute phase of infection. The lowest concentration of SARS-CoV-2 viral copies this assay can detect is 131 copies/mL. A negative result does not preclude SARS-Cov-2 infection and should not be used as the sole basis for treatment or other patient management decisions. A negative result may occur with  improper specimen collection/handling, submission of specimen other than nasopharyngeal swab, presence of viral mutation(s) within the areas targeted by this assay, and inadequate number of viral copies (<131 copies/mL). A negative result must be combined with clinical observations, patient history, and epidemiological information. The expected result is Negative.  Fact Sheet for Patients:  https://www.moore.com/  Fact Sheet for Healthcare Providers:   https://www.young.biz/  This test is no t yet approved or cleared by the [place] FDA and  has been authorized for detection and/or diagnosis of SARS-CoV-2 by FDA under an Emergency Use Authorization (EUA). This EUA will remain  in effect (meaning this test can be used) for the duration of the COVID-19 declaration under Section 564(b)(1) of the Act, 21 U.S.C. section 360bbb-3(b)(1), unless the authorization is terminated or revoked sooner.     Influenza A by PCR NEGATIVE NEGATIVE Final   Influenza B by PCR NEGATIVE NEGATIVE Final    Comment: (NOTE) The Xpert Xpress SARS-CoV-2/FLU/RSV assay is intended as an aid in  the diagnosis of influenza from Nasopharyngeal swab specimens and  should not be used as a sole basis for treatment. Nasal washings and  aspirates are unacceptable for Xpert Xpress SARS-CoV-2/FLU/RSV  testing.  Fact Sheet for Patients: https://www.moore.com/  Fact Sheet for Healthcare Providers: https://www.young.biz/  This test is not yet approved or cleared by the [place] FDA and  has been authorized for detection and/or diagnosis of SARS-CoV-2 by  FDA under an Emergency Use Authorization (EUA). This EUA will remain  in effect (meaning this test can be used) for the duration of the  Covid-19 declaration under Section 564(b)(1) of the Act, 21  U.S.C. section 360bbb-3(b)(1), unless the authorization is  terminated or revoked. Performed at [facility], [address] [place], [place] [address]   Culture, blood (Routine X 2) w Reflex to ID Panel     Status: None (Preliminary result)   Collection Time: 02/15/20  3:16 PM   Specimen: BLOOD  Result Value Ref Range Status   Specimen Description BLOOD RIGHT ANTECUBITAL  Final   Special Requests   Final    BOTTLES DRAWN AEROBIC AND ANAEROBIC Blood Culture results may not be optimal due to an inadequate volume of blood received in culture  bottles   Culture   Final    NO GROWTH 2 DAYS Performed at [facility], [address], [place], [place] [address]    Report Status PENDING  Incomplete  Culture, blood (Routine X 2) w Reflex to ID Panel     Status: None (Preliminary result)   Collection Time: 02/15/20  3:16 PM   Specimen: BLOOD  Result Value Ref Range Status   Specimen Description BLOOD LEFT ANTECUBITAL  Final   Special Requests   Final    BOTTLES DRAWN AEROBIC AND ANAEROBIC Blood Culture results may not be optimal due to an inadequate volume of blood received in culture bottles   Culture   Final    NO GROWTH 2 DAYS Performed at [facility], [address] [place], [place] [address]    Report Status PENDING  Incomplete     Scheduled Meds: . heparin  5,000 Units Subcutaneous Q8H  . ketorolac  30 mg Intravenous Q6H  . nicotine  21 mg Transdermal Daily  . pantoprazole (PROTONIX) IV  40 mg Intravenous Q24H   Continuous Infusions: . sodium chloride 75 mL/hr at 02/17/20 0005  . piperacillin-tazobactam (ZOSYN)  IV 12.5 mL/hr at 02/17/20 0508    Assessment/Plan:  1. Acute sigmoid diverticulitis with microperforation.  Patient's white count is more elevated today at 19.  Patient still having pain in the right lower quadrant.  General surgery ordered a repeat CAT scan to evaluate to see if there is an abscess or not.  Continue clear liquid diet and IV Zosyn. 2. Tobacco abuse on nicotine patch.     Code Status:     Code Status Orders  (From admission, onward)         Start     Ordered   02/15/20 1944  Full code  Continuous        02/15/20 1943        Code Status History    This patient has a current code status but no historical code status.   Advance Care Planning Activity     Family Communication: Wife on the phone Disposition Plan: Status is: Inpatient  Dispo: The patient is from: Home              Anticipated d/c is to: Home              Anticipated d/c date is: Likely  will need a few more days here in the hospital at least depending on repeat CT scan results.              Patient currently with increasing white blood cell count and being treated for acute diverticulitis requiring IV antibiotics.  Consultants:  General surgery  Time spent: 27 minutes  [employer]

## 2020-02-17 NOTE — Transfer of Care (Signed)
Immediate Anesthesia Transfer of Care Note  Patient: Louis Singh  Procedure(s) Performed: COLECTOMY WITH COLOSTOMY CREATION/HARTMANN PROCEDURE (N/A )  Patient Location: PACU  Anesthesia Type:General  Level of Consciousness: awake, alert  and oriented  Airway & Oxygen Therapy: Patient connected to nasal cannula oxygen  Post-op Assessment: Post -op Vital signs reviewed and stable  Post vital signs: stable  Last Vitals:  Vitals Value Taken Time  BP 107/68 02/17/20 2002  Temp    Pulse 91 02/17/20 2005  Resp 14 02/17/20 2005  SpO2 92 % 02/17/20 2005  Vitals shown include unvalidated device data.  Last Pain:  Vitals:   02/17/20 1457  TempSrc: Temporal  PainSc:          Complications: No complications documented.

## 2020-02-17 NOTE — Progress Notes (Signed)
Patient ID: Louis Singh, male   DOB: Dec 28, 1975, 44 y.o.   MRN: [id number]  Case discussed with Dr. Aleen Campi general surgery.  CT scan showing pneumoperitoneum with perforated colon.  Patient will go to the operating room today.  General surgery will take over care and the medical team will sign off.  Dr Alford Highland

## 2020-02-17 NOTE — Progress Notes (Signed)
02/17/20  CT scan abdomen/pelvis done today due to worsening WBC.  Have personally viewed the images and discussed with the patient.  Patient has pneumoperitoneum from his perforated diverticulitis.  Discussed with the patient that conservative measures would not help in this situation and we should take him to the OR for exploratory laparotomy and Hartmann's procedure.  Discussed risks of bleeding, infection, injury to surrounding structures, and he's willing to proceed.  Discussed post-op recovery, activity restrictions, and long-term plans for reversal.  Henrene Dodge, MD

## 2020-02-17 NOTE — Anesthesia Preprocedure Evaluation (Signed)
Anesthesia Evaluation  Patient identified by MRN, date of birth, ID band Patient awake  General Assessment Comment:Presented to hospital with perforated diverticulum, free air in abdomen. Patient denies any nausea/vomiting, has been making stool, tolerating PO.  Reviewed: Allergy & Precautions, NPO status , Patient's Chart, lab work & pertinent test results  History of Anesthesia Complications Negative for: history of anesthetic complications  Airway Mallampati: I  TM Distance: >3 FB Neck ROM: Full    Dental no notable dental hx. (+) Teeth Intact   Pulmonary neg pulmonary ROS, neg sleep apnea, neg COPD, Patient abstained from smoking.Not current smoker,  Occasionally vapes   Pulmonary exam normal breath sounds clear to auscultation       Cardiovascular Exercise Tolerance: Good METS(-) hypertension(-) CAD and (-) Past MI negative cardio ROS  (-) dysrhythmias  Rhythm:Regular Rate:Normal - Systolic murmurs    Neuro/Psych negative neurological ROS  negative psych ROS   GI/Hepatic neg GERD  ,(+)     (-) substance abuse  ,   Endo/Other  neg diabetes  Renal/GU negative Renal ROS     Musculoskeletal   Abdominal (+)  Abdomen: tender.    Peds  Hematology   Anesthesia Other Findings Past Medical History: No date: Diverticulosis No date: Tobacco abuse  Reproductive/Obstetrics                             Anesthesia Physical Anesthesia Plan  ASA: II  Anesthesia Plan: General   Post-op Pain Management:    Induction: Intravenous and Rapid sequence  PONV Risk Score and Plan: 3 and Ondansetron, Dexamethasone, Propofol infusion, TIVA and Midazolam  Airway Management Planned: Oral ETT  Additional Equipment: None  Intra-op Plan:   Post-operative Plan: Extubation in OR  Informed Consent: I have reviewed the patients History and Physical, chart, labs and discussed the procedure including  the risks, benefits and alternatives for the proposed anesthesia with the patient or authorized representative who has indicated his/her understanding and acceptance.     Dental advisory given  Plan Discussed with: CRNA and Surgeon  Anesthesia Plan Comments: (Discussed risks of anesthesia with patient, including PONV, sore throat, lip/dental damage. Rare risks discussed as well, such as cardiorespiratory and neurological sequelae. Patient understands.)        Anesthesia Quick Evaluation

## 2020-02-18 ENCOUNTER — Encounter: Payer: Self-pay | Admitting: Surgery

## 2020-02-18 LAB — CBC
HCT: 37 % — ABNORMAL LOW (ref 39.0–52.0)
Hemoglobin: 12.5 g/dL — ABNORMAL LOW (ref 13.0–17.0)
MCH: 30 pg (ref 26.0–34.0)
MCHC: 33.8 g/dL (ref 30.0–36.0)
MCV: 88.9 fL (ref 80.0–100.0)
Platelets: 311 10*3/uL (ref 150–400)
RBC: 4.16 MIL/uL — ABNORMAL LOW (ref 4.22–5.81)
RDW: 12.6 % (ref 11.5–15.5)
WBC: 17.5 10*3/uL — ABNORMAL HIGH (ref 4.0–10.5)
nRBC: 0 % (ref 0.0–0.2)

## 2020-02-18 LAB — BASIC METABOLIC PANEL
Anion gap: 11 (ref 5–15)
BUN: 13 mg/dL (ref 6–20)
CO2: 24 mmol/L (ref 22–32)
Calcium: 8.1 mg/dL — ABNORMAL LOW (ref 8.9–10.3)
Chloride: 105 mmol/L (ref 98–111)
Creatinine, Ser: 0.75 mg/dL (ref 0.61–1.24)
GFR, Estimated: >60 mL/min (ref >60)
Glucose, Bld: 134 mg/dL — ABNORMAL HIGH (ref 70–99)
Potassium: 4.1 mmol/L (ref 3.5–5.1)
Sodium: 140 mmol/L (ref 135–145)

## 2020-02-18 LAB — MAGNESIUM: Magnesium: 2.1 mg/dL (ref 1.7–2.4)

## 2020-02-18 NOTE — Progress Notes (Signed)
[facility] SURGICAL PROGRESS NOTE  Hospital Day(s): 2.   Post op day(s): 1 Day Post-Op.   Interval History:  Patient seen and examined no acute events or new complaints overnight.  Patient reports he is doing okay, abdominal pain at incision No fever, chills, nausea, emesis Leukocytosis improved this morning, down to 17.5K Renal function normal, sCr - 0.75, UO - 1.9L No significant electrolyte derangements NGT with 600 ccs out Surgical drain with 165 ccs out; serosanguinous No recorded colostomy function Has been NPO Not mobilized yet  Vital signs in last 24 hours: [min-max] current  Temp:  [97 F (36.1 C)-99.4 F (37.4 C)] 98.1 F (36.7 C) (10/15 0419) Pulse Rate:  [71-103] 71 (10/15 0419) Resp:  [13-20] 16 (10/15 0419) BP: (92-131)/(61-80) 110/75 (10/15 0419) SpO2:  [92 %-97 %] 96 % (10/15 0419)     Height: 5\' 10"  (177.8 cm) Weight: 77.1 kg BMI (Calculated): 24.39   Intake/Output last 2 shifts:  10/14 0701 - 10/15 0700 In: 3915.6 [I.V.:3899.7; IV Piggyback:15.9] Out: 2815 [Urine:1950; Emesis/NG output:600; Drains:165; Blood:100]   Physical Exam:  Constitutional: alert, cooperative and no distress  HEENT: NGT in place, output rusty appearance Respiratory: breathing non-labored at rest  Cardiovascular: regular rate and sinus rhythm  Gastrointestinal: Soft, incisional soreness, non-distended, no rebound?guarding. 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, pink, patent, no gas or stool in bag Genitourinary: Foley in place Integumentary: Laparotomy incision is CDI with staples, no erythema, minimal drainage on honeycomb  Labs:  CBC Latest Ref Rng & Units 02/18/2020 02/17/2020 02/16/2020  WBC 4.0 - 10.5 K/uL 17.5(H) 19.0(H) 17.0(H)  Hemoglobin 13.0 - 17.0 g/dL 12.5(L) 12.5(L) 13.7  Hematocrit 39 - 52 % 37.0(L) 36.2(L) 40.6  Platelets 150 - 400 K/uL 311 297 358   CMP Latest Ref Rng & Units 02/18/2020 02/16/2020  02/15/2020  Glucose 70 - 99 mg/dL 04/16/2020) 814(G) 818(H)  BUN 6 - 20 mg/dL 13 14 13   Creatinine 0.61 - 1.24 mg/dL 631(S 9.70  Sodium 135 - 145 mmol/L 140 137 138  Potassium 3.5 - 5.1 mmol/L 4.1 4.1 4.0  Chloride 98 - 111 mmol/L 105 103 102  CO2 22 - 32 mmol/L 24 23 25   Calcium 8.9 - 10.3 mg/dL 8.1(L) 8.4(L) 9.3  Total Protein 6.5 - 8.1 g/dL - - 8.1  Total Bilirubin 0.3 - 1.2 mg/dL - - 0.6  Alkaline Phos 38 - 126 U/L - - 79  AST 15 - 41 U/L - - 17  ALT 0 - 44 U/L - - 17     Imaging studies: No new pertinent imaging studies   Assessment/Plan:  44 y.o. male 1 Day Post-Op s/p Hartman's Procedure for perforated diverticulitis.   - Recommend continue NGT output until colostomy function returns; LIS; monitor and record output   - Continue NPO + IVF resuscitation  - Continue IV Abx (Zosyn); Day Day 3/14   - Continue IV PPI  - Discontinue foley catheter  - Monitor abdominal examination; on-going bowel function    - Pain control prn; antiemetics prn  - Mobilization as tolerates  - DVT prophylaxis  All of the above findings and recommendations were discussed with the patient, and the medical team, and all of patient's questions were answered to his expressed satisfaction.  -- , PA-C [facility] 02/18/2020, 7:14 AM [phone] M-F: 7am - 4pm

## 2020-02-18 NOTE — Consult Note (Addendum)
WOC Nurse ostomy consult note Pt had colostomy surgery performed yesterday.  Stoma is red and viable, above skin level, small amt pink drainage when visualized though the pouch which is intact with good seal.  States his wife will be coming into the hospital at 12:00 today and I will return to perform a teaching session at that time.  Educational materials left at the bedside and 4 sets of wafers and pouches in the room for staff nurse's use.   12:00: Education provided: Demonstrated pouch change using barrier ring and 2 piece 2 1/4 inch pouching system.  His wife was present for the teaching session. Pt asked appropriate questions and assisted with pouch application.  He was able to open and close to empty.  Stoma is red and viable, above skin level, 1 3/4 inches. No stool or flatus in the pouch and pt has an NG. Discussed pouching routines, emptying, and ordering supplies. Educational materials left at the bedside and 4 sets of wafers and pouches in the room for staff nurse's use.  Enrolled patient in [facility] DC program: Yes Cammie Mcgee MSN, RN, [place], [place], [place] [phone]

## 2020-02-19 LAB — BASIC METABOLIC PANEL
Anion gap: 10 (ref 5–15)
BUN: 19 mg/dL (ref 6–20)
CO2: 27 mmol/L (ref 22–32)
Calcium: 8.2 mg/dL — ABNORMAL LOW (ref 8.9–10.3)
Chloride: 106 mmol/L (ref 98–111)
Creatinine, Ser: 0.79 mg/dL (ref 0.61–1.24)
GFR, Estimated: >60 mL/min (ref >60)
Glucose, Bld: 109 mg/dL — ABNORMAL HIGH (ref 70–99)
Potassium: 3.7 mmol/L (ref 3.5–5.1)
Sodium: 143 mmol/L (ref 135–145)

## 2020-02-19 LAB — CBC
HCT: 32.5 % — ABNORMAL LOW (ref 39.0–52.0)
Hemoglobin: 11 g/dL — ABNORMAL LOW (ref 13.0–17.0)
MCH: 30.7 pg (ref 26.0–34.0)
MCHC: 33.8 g/dL (ref 30.0–36.0)
MCV: 90.8 fL (ref 80.0–100.0)
Platelets: 334 10*3/uL (ref 150–400)
RBC: 3.58 MIL/uL — ABNORMAL LOW (ref 4.22–5.81)
RDW: 13.1 % (ref 11.5–15.5)
WBC: 15.2 10*3/uL — ABNORMAL HIGH (ref 4.0–10.5)
nRBC: 0 % (ref 0.0–0.2)

## 2020-02-19 LAB — MAGNESIUM: Magnesium: 2.2 mg/dL (ref 1.7–2.4)

## 2020-02-19 MED ORDER — DIPHENHYDRAMINE HCL 50 MG/ML IJ SOLN
12.5000 mg | Freq: Every evening | INTRAMUSCULAR | Status: DC | PRN
  Administered 2020-02-19: 12.5 mg via INTRAVENOUS
  Filled 2020-02-19: qty 1

## 2020-02-19 MED ORDER — KCL IN DEXTROSE-NACL 20-5-0.45 MEQ/L-%-% IV SOLN
INTRAVENOUS | Status: DC
  Filled 2020-02-19 (×9): qty 1000

## 2020-02-19 NOTE — Progress Notes (Signed)
02/19/2020  Subjective: Patient is 2 Days Post-Op s/p exlap and Hartmann's procedure.  No acute events.  Patient reports he feels better this morning.  His NG output is lower yesterday compared to prior day.  No real bowel function in his bag, though has had some stool balls.  WBC continues to improve.  Vital signs: Temp:  [98 F (36.7 C)-98.6 F (37 C)] 98 F (36.7 C) (10/16 1135) Pulse Rate:  [48-68] 48 (10/16 1135) Resp:  [16-22] 16 (10/16 1135) BP: (107-113)/(67-75) 107/73 (10/16 1135) SpO2:  [94 %-97 %] 97 % (10/16 1135)   Intake/Output: 10/15 0701 - 10/16 0700 In: 3030.3 [I.V.:2882.9; IV Piggyback:147.3] Out: 1910 [Urine:500; Emesis/NG output:1200; Drains:210] Last BM Date: 02/18/20  Physical Exam: Constitutional:  No acute distress Abdomen:  Soft, non-distended, appropriately tender to palpation.  Midline incision is clean, dry, intact with staples.  RLQ drain with serosanguinous fluid.  LLQ ostomy pink, mildly edematous, with some stoolballs in bag, but no real gas/stool yet.  Labs:  Recent Labs    02/18/20 0424 02/19/20 0601  WBC 17.5* 15.2*  HGB 12.5* 11.0*  HCT 37.0* 32.5*  PLT 311 334   Recent Labs    02/18/20 0424 02/19/20 0601  NA 140 143  K 4.1 3.7  CL 105 106  CO2 24 27  GLUCOSE 134* 109*  BUN 13 19  CREATININE 0.75 0.79  CALCIUM 8.1* 8.2*   No results for input(s): LABPROT, INR in the last 72 hours.  Imaging: No results found.  Assessment/Plan: This is a 44 y.o. male s/p exlap and Hartmann's.  --Continue NPO and NG tube to suction today.  Hopefully may be able to do clamp trial tomorrow, but all depends on his bowel function. --Continue IV abx --OOB, ambulate    [name], MD [facility]

## 2020-02-20 LAB — BASIC METABOLIC PANEL
Anion gap: 7 (ref 5–15)
BUN: 17 mg/dL (ref 6–20)
CO2: 29 mmol/L (ref 22–32)
Calcium: 8.1 mg/dL — ABNORMAL LOW (ref 8.9–10.3)
Chloride: 109 mmol/L (ref 98–111)
Creatinine, Ser: 0.91 mg/dL (ref 0.61–1.24)
GFR, Estimated: >60 mL/min (ref >60)
Glucose, Bld: 128 mg/dL — ABNORMAL HIGH (ref 70–99)
Potassium: 4 mmol/L (ref 3.5–5.1)
Sodium: 145 mmol/L (ref 135–145)

## 2020-02-20 LAB — CBC
HCT: 31.6 % — ABNORMAL LOW (ref 39.0–52.0)
Hemoglobin: 10.4 g/dL — ABNORMAL LOW (ref 13.0–17.0)
MCH: 30 pg (ref 26.0–34.0)
MCHC: 32.9 g/dL (ref 30.0–36.0)
MCV: 91.1 fL (ref 80.0–100.0)
Platelets: 363 10*3/uL (ref 150–400)
RBC: 3.47 MIL/uL — ABNORMAL LOW (ref 4.22–5.81)
RDW: 13.4 % (ref 11.5–15.5)
WBC: 13.4 10*3/uL — ABNORMAL HIGH (ref 4.0–10.5)
nRBC: 0 % (ref 0.0–0.2)

## 2020-02-20 LAB — CULTURE, BLOOD (ROUTINE X 2): Culture: NO GROWTH

## 2020-02-20 LAB — MAGNESIUM: Magnesium: 2.1 mg/dL (ref 1.7–2.4)

## 2020-02-20 MED ORDER — KETOROLAC TROMETHAMINE 30 MG/ML IJ SOLN
30.0000 mg | Freq: Four times a day (QID) | INTRAMUSCULAR | Status: DC
  Administered 2020-02-20 – 2020-02-23 (×11): 30 mg via INTRAVENOUS
  Filled 2020-02-20 (×11): qty 1

## 2020-02-20 MED ORDER — MELATONIN 5 MG PO TABS
5.0000 mg | ORAL_TABLET | Freq: Every evening | ORAL | Status: DC | PRN
  Administered 2020-02-20 – 2020-02-23 (×3): 5 mg via ORAL
  Filled 2020-02-20 (×3): qty 1

## 2020-02-20 NOTE — Progress Notes (Signed)
02/20/2020  Subjective: Patient is 3 Days Post-Op status post Hartman's procedure.  No acute events.  Patient has started to have bowel function with some ostomy output including gas and stool.  Reports that he is feeling well with no particular amount of pain.  He is able to empty his bag on his own.  White blood cell count continues to improve and is 13.4 today.  Vital signs: Temp:  [97.6 F (36.4 C)-98.4 F (36.9 C)] 97.6 F (36.4 C) (10/17 1145) Pulse Rate:  [43-49] 43 (10/17 1145) Resp:  [16-20] 16 (10/17 1145) BP: (110-121)/(65-73) 115/73 (10/17 1145) SpO2:  [97 %-100 %] 100 % (10/17 1145)   Intake/Output: 10/16 0701 - 10/17 0700 In: 1251.3 [I.V.:1151.3; IV Piggyback:100] Out: 1645 [Urine:725; Emesis/NG output:800; Stool:120] Last BM Date: 02/20/20  Physical Exam: Constitutional: No acute distress Abdomen: Soft, nondistended, not particularly tender to palpation.  Midline incision is clean, dry, intact.  Right lower quadrant drain with serosanguineous fluid.  Left lower quadrant ostomy pink and viable with gas and liquid stool in the bag.  Labs:  Recent Labs    02/19/20 0601 02/20/20 0429  WBC 15.2* 13.4*  HGB 11.0* 10.4*  HCT 32.5* 31.6*  PLT 334 363   Recent Labs    02/19/20 0601 02/20/20 0429  NA 143 145  K 3.7 4.0  CL 106 109  CO2 27 29  GLUCOSE 109* 128*  BUN 19 17  CREATININE 0.79 0.91  CALCIUM 8.2* 8.1*   No results for input(s): LABPROT, INR in the last 72 hours.  Imaging: No results found.  Assessment/Plan: This is a 44 y.o. male s/p exploratory laparotomy and Hartman's procedure.  -We will start clamping trial to check residuals at 1 PM.  If less than 150 mL residual, will be okay to DC the NG tube today and start clear liquid diet.  Otherwise we will continue NG to suction. -Continue IV antibiotic, ambulation, DVT prophylaxis.   [name], MD  [facility]

## 2020-02-21 LAB — SURGICAL PATHOLOGY

## 2020-02-21 LAB — CBC
HCT: 29.8 % — ABNORMAL LOW (ref 39.0–52.0)
Hemoglobin: 9.8 g/dL — ABNORMAL LOW (ref 13.0–17.0)
MCH: 30.2 pg (ref 26.0–34.0)
MCHC: 32.9 g/dL (ref 30.0–36.0)
MCV: 91.7 fL (ref 80.0–100.0)
Platelets: 368 10*3/uL (ref 150–400)
RBC: 3.25 MIL/uL — ABNORMAL LOW (ref 4.22–5.81)
RDW: 13.8 % (ref 11.5–15.5)
WBC: 14.5 10*3/uL — ABNORMAL HIGH (ref 4.0–10.5)
nRBC: 0 % (ref 0.0–0.2)

## 2020-02-21 MED ORDER — ENSURE ENLIVE PO LIQD
237.0000 mL | Freq: Two times a day (BID) | ORAL | Status: DC
  Administered 2020-02-21 – 2020-02-23 (×4): 237 mL via ORAL

## 2020-02-21 NOTE — Progress Notes (Signed)
[facility] SURGICAL PROGRESS NOTE  Hospital Day(s): 5.   Post op day(s): 4 Days Post-Op.   Interval History:  Patient seen and examined no acute events or new complaints overnight.  Patient reports he is feeling well this morning Abdominal soreness, pain regimen helping greatly No distension, nausea, emesis Leukocytosis slightly worse this morning, up to 14.5K, no fevers NGT removed yesterday after passed clamping trial; started on CLD Surgical drain with 20 ccs out; serosanguinous  Vital signs in last 24 hours: [min-max] current  Temp:  [97.6 F (36.4 C)-99.3 F (37.4 C)] 99.3 F (37.4 C) (10/17 2339) Pulse Rate:  [43-56] 52 (10/17 2339) Resp:  [16-18] 18 (10/17 2339) BP: (110-130)/(72-83) 118/83 (10/17 2339) SpO2:  [97 %-100 %] 98 % (10/17 2339)     Height: 5\' 10"  (177.8 cm) Weight: 77.1 kg BMI (Calculated): 24.39   Intake/Output last 2 shifts:  10/17 0701 - 10/18 0700 In: 3175.9 [I.V.:2975.9; IV Piggyback:200] Out: 520 [Urine:500; Drains:20]   Physical Exam:  Constitutional: alert, cooperative and no distress  Respiratory: breathing non-labored at rest  Cardiovascular: regular rate and sinus rhythm  Gastrointestinal: Soft, mild incisional soreness, non-distended, no rebound/guarding. 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, pink, patent, gas and stool in bag Integumentary: Laparotomy incision is CDI with staples, no erythema, minimal drainage on honeycomb  Labs:  CBC Latest Ref Rng & Units 02/21/2020 02/20/2020 02/19/2020  WBC 4.0 - 10.5 K/uL 14.5(H) 13.4(H) 15.2(H)  Hemoglobin 13.0 - 17.0 g/dL 02/21/2020) 10.4(L) 11.0(L)  Hematocrit 39 - 52 % 29.8(L) 31.6(L) 32.5(L)  Platelets 150 - 400 K/uL 368 363 334   CMP Latest Ref Rng & Units 02/20/2020 02/19/2020 02/18/2020  Glucose 70 - 99 mg/dL 02/20/2020) 096(E) 454(U)  BUN 6 - 20 mg/dL 17 19 13   Creatinine 0.61 - 1.24 mg/dL 981(X 9.14  Sodium 135 - 145 mmol/L 145 143  140  Potassium 3.5 - 5.1 mmol/L 4.0 3.7 4.1  Chloride 98 - 111 mmol/L 109 106 105  CO2 22 - 32 mmol/L 29 27 24   Calcium 8.9 - 10.3 mg/dL 8.1(L) 8.2(L) 8.1(L)  Total Protein 6.5 - 8.1 g/dL - - -  Total Bilirubin 0.3 - 1.2 mg/dL - - -  Alkaline Phos 38 - 126 U/L - - -  AST 15 - 41 U/L - - -  ALT 0 - 44 U/L - - -     Imaging studies: No new pertinent imaging studies   Assessment/Plan: 44 y.o. male with slight bump in leukocytosis, otherwise doing well 4 Days Post-Op s/p Hartman's Procedure for perforated diverticulitis   - Will advance to FLD this morning + add Ensure   - Wean IVF resuscitation  - Monitor leukocytosis; if continues upward trend may consider repeat CT in next 24-48 hours             - Continue IV Abx (Zosyn); Day Day 6/14    - Monitor abdominal examination; on-going bowel function               - Pain control prn; antiemetics prn             - Mobilization as tolerates  - Appreciate WOC RN assistance with colostomy teaching             - DVT prophylaxis  All of the above findings and recommendations were discussed with the patient, and the medical team, and all of patient's questions were answered to his expressed satisfaction.  --  Lynden Oxford, PA-C [facility] 02/21/2020, 7:13 AM [phone] M-F: 7am - 4pm

## 2020-02-21 NOTE — Consult Note (Signed)
WOC Nurse ostomy follow up Stoma type/location: LLQ colostomy, pouch applied Friday is intact. Will plan for pouch change tomorrow am. Stomal assessment/size: per measurement on Friday, 1 and 3/4 inches round. Viewed through pouch as red, budded, moist and functioning. Peristomal assessment: Not seen today Treatment options for stomal/peristomal skin: skin barrier ring Output: brown stool. Ostomy pouching: 2pc. 2 and 1/4 inch pouching system with skin barrier ring Education provided: Patient is emptying puch independently in the bathroom at the time of my visit. Using toilet paper to clean tail closure and closing Lock and Roll closure mechanism independently. Enrolled patient in [place] [place] Secure Start Discharge program: Yes, previously  Pouch change planned for tomorrow am.  Supplies at bedside: 1 pouching set up for tomorrow (pouch, skin barrier and skin barrier ring) and 4 pouching set ups for discharge (4 pouches, 4 skin barriers and 4 skin barrier rings).  WOC nursing team will follow, and will remain available to this patient, the nursing and medical teams.  Thanks, Ladona Mow, MSN, RN, GNP, [facility] [place]  Pager# [phone]

## 2020-02-22 ENCOUNTER — Encounter: Payer: Self-pay | Admitting: Internal Medicine

## 2020-02-22 ENCOUNTER — Inpatient Hospital Stay: Payer: Self-pay

## 2020-02-22 LAB — CBC
HCT: 31.7 % — ABNORMAL LOW (ref 39.0–52.0)
Hemoglobin: 10.6 g/dL — ABNORMAL LOW (ref 13.0–17.0)
MCH: 30.4 pg (ref 26.0–34.0)
MCHC: 33.4 g/dL (ref 30.0–36.0)
MCV: 90.8 fL (ref 80.0–100.0)
Platelets: 407 10*3/uL — ABNORMAL HIGH (ref 150–400)
RBC: 3.49 MIL/uL — ABNORMAL LOW (ref 4.22–5.81)
RDW: 13.7 % (ref 11.5–15.5)
WBC: 15.2 10*3/uL — ABNORMAL HIGH (ref 4.0–10.5)
nRBC: 0 % (ref 0.0–0.2)

## 2020-02-22 MED ORDER — SODIUM CHLORIDE 0.9 % IV SOLN
INTRAVENOUS | Status: DC | PRN
  Administered 2020-02-22: 250 mL via INTRAVENOUS

## 2020-02-22 MED ORDER — IOHEXOL 9 MG/ML PO SOLN
500.0000 mL | ORAL | Status: AC
  Administered 2020-02-22 (×2): 500 mL via ORAL

## 2020-02-22 MED ORDER — IOHEXOL 300 MG/ML  SOLN
100.0000 mL | Freq: Once | INTRAMUSCULAR | Status: AC | PRN
  Administered 2020-02-22: 100 mL via INTRAVENOUS

## 2020-02-22 MED ORDER — OXYCODONE-ACETAMINOPHEN 5-325 MG PO TABS
1.0000 | ORAL_TABLET | ORAL | Status: DC | PRN
  Administered 2020-02-23: 1 via ORAL
  Filled 2020-02-22: qty 1

## 2020-02-22 NOTE — Consult Note (Signed)
WOC Nurse ostomy follow up Stoma type/location: LLQ colostomy Stomal assessment/size: 1 and 5/8 inches round, red, edematous Peristomal assessment: clear Treatment options for stomal/peristomal skin: skin barrier ring Output brown stool Ostomy pouching: 2pc. 2 and 3/4 inch pouching system with skin barrier ring.  Education: Patient participated in ostomy pouch change with education and cueing. Assisted with tracing pattern, was able to cut pouch aperture independently. Provided cueing for skin barrier ring placement and sizing. Assisted with pouch removal, cleansing of stoma. Taught that itching or burning on the peristomal skin may be an early indicator of pouch leakage.  Applied pouch with ring attached with assistance, assisted to attach pouch.Lock and Roll closure is engaged, patient is independent in emptying.  Discussed closed end pouches with gas filters for use with sexual activity and exercise, also discussed showering with pouch off on days a change is planned. Reassured patient that no water will enter colostomy. Education provided:  Enrolled patient in [employer] Discharge program: Yes  Patient is going for CT scan this morning due to increasing WBC counts. If no findings, surgical team plans for discharge tomorrow.  WOC nursing team will follow, and will remain available to this patient, the nursing and medical teams.  Please re-consult if needed. Thanks, Ladona Mow, MSN, RN, GNP, [facility] [place]  Pager# [phone]

## 2020-02-22 NOTE — Progress Notes (Signed)
[facility] SUR[facility]ICAL PROGRESS NOTE  Hospital Day(s): 6.   Post op day(s): 5 Days Post-Op.   Interval History:  Patient seen and examined no acute events or new complaints overnight.  Patient reports he is feeling great, he appears in bright spirits No fever, chills, nausea, emesis Leukocytosis is again mildly elevated this morning, now up to 15.2 No other labs this morning Making urine, UO - 2.1L Surgical drain with 30 ccs out in last 24 hours, serosanguinous  Colostomy function not recorded; however, he is passing stool and flatus Advanced to full liquids; tolerating well Mobilizing without issue   Vital signs in last 24 hours: [min-max] current  Temp:  [98.2 F (36.8 C)-99 F (37.2 C)] 98.7 F (37.1 C) (10/19 0407) Pulse Rate:  [50-69] 50 (10/19 0407) Resp:  [16] 16 (10/19 0407) BP: (115-127)/(78-83) 115/79 (10/19 0407) SpO2:  [97 %-100 %] 97 % (10/19 0407)     Height: 5\' 10"  (177.8 cm) Weight: 77.1 kg BMI (Calculated): 24.39   Intake/Output last 2 shifts:  10/18 0701 - 10/19 0700 In: 1312.4 [P.O.:120; I.V.:1082.6; IV Piggyback:109.8] Out: 2130 [Urine:2100; Drains:30]   Physical Exam:  Constitutional: alert, cooperative and no distress Respiratory: breathing non-labored at rest  Cardiovascular: regular rate and sinus rhythm  Gastrointestinal:Soft,mild incisional soreness, non-distended, no rebound/guarding. 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, pink, patent, recently changed, WOC at bedside Integumentary:Laparotomy incision is CDI with staples, no erythema,minimaldrainage on honeycomb  Labs:  CBC Latest Ref Rng & Units 02/22/2020 02/21/2020 02/20/2020  WBC 4.0 - 10.5 K/uL 15.2(H) 14.5(H) 13.4(H)  Hemoglobin 13.0 - 17.0 g/dL 10.6(L) 9.8(L) 10.4(L)  Hematocrit 39 - 52 % 31.7(L) 29.8(L) 31.6(L)  Platelets 150 - 400 K/uL 407(H) 368 363   CMP Latest Ref Rng & Units 02/20/2020 02/19/2020 02/18/2020   Glucose 70 - 99 mg/dL 02/20/2020) 938(H) 829(H)  BUN 6 - 20 mg/dL 17 19 13   Creatinine 0.61 - 1.24 mg/dL 371(I 9.67  Sodium 135 - 145 mmol/L 145 143 140  Potassium 3.5 - 5.1 mmol/L 4.0 3.7 4.1  Chloride 98 - 111 mmol/L 109 106 105  CO2 22 - 32 mmol/L 29 27 24   Calcium 8.9 - 10.3 mg/dL 8.1(L) 8.2(L) 8.1(L)  Total Protein 6.5 - 8.1 g/dL - - -  Total Bilirubin 0.3 - 1.2 mg/dL - - -  Alkaline Phos 38 - 126 U/L - - -  AST 15 - 41 U/L - - -  ALT 0 - 44 U/L - - -     Imaging studies: No new pertinent imaging studies   Assessment/Plan:  44 y.o. male with upward trend in leukocytosis, otherwise doing well 5 Days Post-Op s/p Hartman's Procedurefor perforated diverticulitis   - Given upward trend in leukocytosis, I will repeat CT Abdomen/pelvis this morning to rule out intra-abdominal processes, such as an abscess.    - Continue regular diet   - Continue IV Abx (Zosyn); Day Day 7/14              - Monitor abdominal examination; on-going bowel function  - Pain control prn; antiemetics prn - Mobilization as tolerates             - Appreciate WOC RN assistance with colostomy teaching - DVT prophylaxis   - Discharge Planning; Pending CT A/P today; tentative plan for discharge home tomorrow pending work up this morning   All of the above findings and recommendations were discussed with the patient, and the medical team, and  all of patient's questions were answered to his expressed satisfaction.  -- Lynden Oxford, PA-C [facility] 02/22/2020, 7:20 AM [phone] M-F: 7am - 4pm

## 2020-02-23 LAB — CBC
HCT: 33.3 % — ABNORMAL LOW (ref 39.0–52.0)
Hemoglobin: 11.2 g/dL — ABNORMAL LOW (ref 13.0–17.0)
MCH: 30 pg (ref 26.0–34.0)
MCHC: 33.6 g/dL (ref 30.0–36.0)
MCV: 89.3 fL (ref 80.0–100.0)
Platelets: 556 10*3/uL — ABNORMAL HIGH (ref 150–400)
RBC: 3.73 MIL/uL — ABNORMAL LOW (ref 4.22–5.81)
RDW: 13.4 % (ref 11.5–15.5)
WBC: 14.1 10*3/uL — ABNORMAL HIGH (ref 4.0–10.5)
nRBC: 0 % (ref 0.0–0.2)

## 2020-02-23 MED ORDER — IBUPROFEN 800 MG PO TABS: 800.0000 mg | ORAL_TABLET | Freq: Three times a day (TID) | ORAL | 0 refills | Status: DC | PRN

## 2020-02-23 MED ORDER — OXYCODONE-ACETAMINOPHEN 5-325 MG PO TABS: 1.0000 | ORAL_TABLET | ORAL | 0 refills | Status: DC | PRN

## 2020-02-23 MED ORDER — AMOXICILLIN-POT CLAVULANATE 875-125 MG PO TABS: 1.0000 | ORAL_TABLET | Freq: Two times a day (BID) | ORAL | 0 refills | Status: AC

## 2020-02-23 NOTE — Progress Notes (Signed)
Discharge instructions given to patient. Verbalized understanding. No acute distress. Patient to call for a wheelchair once ride arrives.

## 2020-02-23 NOTE — Discharge Summary (Addendum)
[facility] SURGICAL DISCHARGE SUMMARY  Patient ID: Louis Singh MRN: [id number] DOB/AGE: 11/27/1975 44 y.o.  Admit date: 02/15/2020 Discharge date: 02/23/2020  Discharge Diagnoses Patient Active Problem List   Diagnosis Date Noted  . Diverticulitis 02/16/2020  . Leukocytosis   . Acute diverticulitis 02/15/2020  . Tobacco abuse   . Perforated diverticulum     Consultants None  Procedures 02/17/2020:  Hartman's Procedure  HPI: 44 y.o. male presented to [facility] ED today for evaluation of abdominal pain. Patient reports he initially noticed some lower abdominal discomfort around Friday/Saturday. He believed this was related to constipation at first however the pain continue to progress and became more constant and severe. He described this as a severe crampy pain. Nothing seemed to make this better. He tried a laxative as he had become constipated in the days prior without significant relief in his pain. He endorses a subjective fever, nausea, and emesis. No chills, cough, CP, SOB, urinary changes, or blood in his stools. He has a history of diverticulitis in the past. This episodes feels similar to those but is slightly more severe. In the past, he has managed his diverticulitis flare ups with CLD and rest. He believes this is his second episode in the last year but about the 8th episode in his lifetime. He is colonoscopy naive. No previous abdominal surgeries. Laboratory work up in the ED was relatively reassuring and he was without leukocytosis. CT Abdomen/Pelvis was concerned for acute sigmoid diverticulitis with question of microperforation.   Hospital Course: Patient was admitted initially to hospitalist service and underwent conservative management.he clinically did well however he a persistently rising leukocytosis. CT Abdomen/pelvis was repeated on 10/14 which was concerning for worsening pneumoperitoneum. Informed consent was obtained and documented, and patient  underwent uneventful Hartman's Procedure (Dr Aleen Campi, 02/17/2020). Post-operatively, patient's did well but again had issues with leukocytosis. He underwent repeat CT Abdomen/Pelvis on POD5 (10/19) which was reassuring and without intra-abdominal abscess. However, on day fo discharge he did notice fluid from his midline wound. I removed staples and revealed wound infection and abscess. This was cultured, irrigated, and wound care reviewed with family. Otherwise, his symptoms and pain improved/resolved and advancement of patient's diet and ambulation were well-tolerated. The remainder of patient's hospital course was essentially unremarkable, and discharge planning was initiated accordingly with patient safely able to be discharged home with appropriate discharge instructions, antibiotics (Augmentin x7 days to complete 14 total), pain control, and outpatient follow-up after all of his questions were answered to his expressed satisfaction.   Discharge Condition: Good   Physical Examination:  Constitutional: alert, cooperative and no distress Respiratory: breathing non-labored at rest  Cardiovascular: regular rate and sinus rhythm  Gastrointestinal:Soft,mild incisional soreness, non-distended, no rebound/guarding. 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, pink, patent with stool and gas in bag Integumentary:Staples removed from inferior to umbilicus, purulence expressed, wound cleaned and packed    Allergies as of 02/23/2020      Reactions   Shellfish Allergy Nausea And Vomiting      Medication List    TAKE these medications   amoxicillin-clavulanate 875-125 MG tablet Commonly known as: Augmentin Take 1 tablet by mouth 2 (two) times daily for 7 days.   ibuprofen 800 MG tablet Commonly known as: ADVIL Take 1 tablet (800 mg total) by mouth every 8 (eight) hours as needed.   oxyCODONE-acetaminophen 5-325 MG tablet Commonly known as:  PERCOCET/ROXICET Take 1 tablet by mouth every 4 (four) hours as needed for  moderate pain or severe pain (30 minutes prior to dressing change).            Discharge Care Instructions  (From admission, onward)         Start     Ordered   02/23/20 0000  Change dressing (specify)       Comments: Dressing change:  Remove previous dressing, pat wound with saline moistened kerlix gauze, cover with dry gauze and secure with tape. This should be done daily   02/23/20 1053            Follow-up Information    Donovan Kail, PA-C Follow up on 02/28/2020.   Specialty: Physician Assistant Why: s/p Hartmans, has drain, wound packing, bring colostomy supplies to appointment....okay to see Louis Singh if needed    11am appointment Contact information: [address] [place] [address] [phone]                Time spent on discharge management including discussion of hospital course, clinical condition, outpatient instructions, prescriptions, and follow up with the patient and members of the medical team: >30 minutes  -- Lynden Oxford , PA-C [facility]  02/23/2020, 10:57 AM [phone] M-F: 7am - 4pm

## 2020-02-23 NOTE — TOC Transition Note (Signed)
Transition of Care [facility]) - CM/SW Discharge Note   Patient Details  Name: Louis Singh MRN: [id number] Date of Birth: [birth year]  [facility] CM/SW Contact:  Bing Quarry, RN Phone Number: 02/23/2020, 12:45 PM   Clinical Narrative:    Patient had discharge orders. Pt. Confirmed no insurance. Discussed medication costs for discharge. Pt. Confirmed can afford cost. RN will send patient home with additional colostomy supplies pending delivery of home supplies. No further concerns. Signing off. Gabriel Cirri RN CM    Final next level of care: Home/Self Care Barriers to Discharge: No Barriers Identified   Patient Goals and CMS Choice Patient states their goals for this hospitalization and ongoing recovery are:: To get home.      Discharge Placement                    Patient and family notified of of transfer: 02/23/20  Discharge Plan and Services                                     Social Determinants of Health (SDOH) Interventions     Readmission Risk Interventions No flowsheet data found.

## 2020-02-28 ENCOUNTER — Ambulatory Visit (INDEPENDENT_AMBULATORY_CARE_PROVIDER_SITE_OTHER): Payer: Self-pay | Admitting: Physician Assistant

## 2020-02-28 ENCOUNTER — Other Ambulatory Visit: Payer: Self-pay

## 2020-02-28 ENCOUNTER — Encounter: Payer: Self-pay | Admitting: Physician Assistant

## 2020-02-28 ENCOUNTER — Telehealth: Payer: Self-pay | Admitting: *Deleted

## 2020-02-28 VITALS — BP 106/66 | HR 80 | Temp 98.3°F | Ht 70.5 in | Wt 156.6 lb

## 2020-02-28 DIAGNOSIS — Z09 Encounter for follow-up examination after completed treatment for conditions other than malignant neoplasm: Secondary | ICD-10-CM

## 2020-02-28 DIAGNOSIS — K5792 Diverticulitis of intestine, part unspecified, without perforation or abscess without bleeding: Secondary | ICD-10-CM

## 2020-02-28 NOTE — Telephone Encounter (Signed)
Faxed FMLA [phone]

## 2020-02-28 NOTE — Progress Notes (Signed)
[facility] POST-OP OFFICE VISIT  02/28/2020  HPI: Louis Singh is a 44 y.o. male 9 days s/p Hartman's Procedure for perforated diverticulitis with Dr Aleen Campi.   He is overall doing better He reports some incisional soreness, taking ibuprofen and percocet intermittently for this.  No fever, chills, nausea, emesis He is tolerating colostomy changes at home without issues, continues to have colostomy function His wife is helping him with dressing changes at home Surgical drain with 10 ccs daily, serosanguinous   Vital signs: BP 106/66   Pulse 80   Temp 98.3 F (36.8 C)   Ht 5' 10.5" (1.791 m)   Wt 156 lb 9.6 oz (71 kg)   SpO2 98%   BMI 22.15 kg/m    Physical Exam: Constitutional: Well appearing male, anxious, NAD Abdomen: Soft, incisional soreness, non-distended, no rebound/guarding. Colostomy in left mid-abdomen, pink, patent, gas and solid stool in bag. Surgical drain is RLQ, serosanguinous output, minimal Skin: Inferior to his umbilicus there is about 3-4 cm of wound healing via secondary intention which track about 2 cm inferiorly. The wound bed is 100% granulation tissue. No erythema or drainage present. The remaining portion of his laparotomy is intact with staples.   Assessment/Plan: This is a 44 y.o. male 9 days s/p Hartman's Procedure for perforated diverticulitis   - Removed surgical drain, placed occlusive dressing, remain in place for 48 hours  - Removed staples, replaced with steri-strips  - Reviewed wound care; packing daily, wife at bedside  - Reviewed colostomy care  - Reviewed lifting restrictions; nothing more than 15 lbs for 6 weeks total  - No need for further ABx  - Pain control prn  - Reviewed surgical pathology; Diverticulitis, feculent peritonitis, negative for malignancy  - RTC in 2 weeks for re-check   -- Lynden Oxford, PA-C [facility] 02/28/2020, 11:58 AM [phone] M-F: 7am - 4pm

## 2020-02-28 NOTE — Patient Instructions (Addendum)
Call us if you need a refill on your pain medication. You may use Ibuprofen as needed and may add one Tylenol if needed every 8 hours.  You can remove your dressing from your drain removal in 24 hours. Cover only if it is still draining.   Your steri strips will come off on their own in 2 weeks.  Continue dressing changes with wet packing.  Follow up here in 2 weeks.

## 2020-03-06 ENCOUNTER — Telehealth: Payer: Self-pay | Admitting: Surgery

## 2020-03-06 NOTE — Telephone Encounter (Signed)
Patient report some tenderness around his stoma area. He has a tiny area that feels a little raw around his stoma site and some moisture there as well. Recommended to patient that he use stoma powder to the site when changing his wafer. He does not have any and will pick up some from our office today.

## 2020-03-06 NOTE — Telephone Encounter (Signed)
Patient is calling and just has some questions, and is asking if one of the nurses would give him a call back. Please call patient and advise

## 2020-03-14 ENCOUNTER — Ambulatory Visit (INDEPENDENT_AMBULATORY_CARE_PROVIDER_SITE_OTHER): Payer: Self-pay | Admitting: Physician Assistant

## 2020-03-14 ENCOUNTER — Encounter: Payer: Self-pay | Admitting: Physician Assistant

## 2020-03-14 ENCOUNTER — Other Ambulatory Visit: Payer: Self-pay

## 2020-03-14 VITALS — BP 120/81 | HR 82 | Temp 98.4°F | Ht 70.0 in | Wt 157.2 lb

## 2020-03-14 DIAGNOSIS — Z09 Encounter for follow-up examination after completed treatment for conditions other than malignant neoplasm: Secondary | ICD-10-CM

## 2020-03-14 DIAGNOSIS — Z8719 Personal history of other diseases of the digestive system: Secondary | ICD-10-CM | POA: Insufficient documentation

## 2020-03-14 DIAGNOSIS — M26629 Arthralgia of temporomandibular joint, unspecified side: Secondary | ICD-10-CM | POA: Insufficient documentation

## 2020-03-14 DIAGNOSIS — K5792 Diverticulitis of intestine, part unspecified, without perforation or abscess without bleeding: Secondary | ICD-10-CM

## 2020-03-14 DIAGNOSIS — G8929 Other chronic pain: Secondary | ICD-10-CM | POA: Insufficient documentation

## 2020-03-14 MED ORDER — OXYCODONE-ACETAMINOPHEN 5-325 MG PO TABS
1.0000 | ORAL_TABLET | ORAL | 0 refills | Status: DC | PRN
Start: 2020-03-14 — End: 2020-05-01

## 2020-03-14 NOTE — Patient Instructions (Addendum)
Laqueta Due, PA suggest patient to follow up with Primary Care Provider about discussing mood changes. Laqueta Due, PA discussed with patient and mother the next recommended steps in a few months at today's visit. Patient advised to try over the counter stool softeners and Miralax, advised to avoid diarrhea and prevent dehydration. Patient informed he may start showering and allow the warm, soapy water to run over the wound.  Laqueta Due, PA will sent over a refill for Oxycodone at today's visit. Patient advised to take Tylenol or Ibuprofen for breakthrough pain.   GENERAL POST-OPERATIVE PATIENT INSTRUCTIONS   WOUND CARE INSTRUCTIONS:  Keep a dry clean dressing on the wound if there is drainage. The initial bandage may be removed after 24 hours.  Once the wound has quit draining you may leave it open to air.  If clothing rubs against the wound or causes irritation and the wound is not draining you may cover it with a dry dressing during the daytime.  Try to keep the wound dry and avoid ointments on the wound unless directed to do so.  If the wound becomes bright red and painful or starts to drain infected material that is not clear, please contact your physician immediately.  If the wound is mildly pink and has a thick firm ridge underneath it, this is normal, and is referred to as a healing ridge.  This will resolve over the next 4-6 weeks.  BATHING: You may shower if you have been informed of this by your surgeon. However, Please do not submerge in a tub, hot tub, or pool until incisions are completely sealed or have been told by your surgeon that you may do so.  DIET:  You may eat any foods that you can tolerate.  It is a good idea to eat a high fiber diet and take in plenty of fluids to prevent constipation.  If you do become constipated you may want to take a mild laxative or take ducolax tablets on a daily basis until your bowel habits are regular.  Constipation can be very uncomfortable, along  with straining, after recent surgery.  ACTIVITY:  You are encouraged to cough and deep breath or use your incentive spirometer if you were given one, every 15-30 minutes when awake.  This will help prevent respiratory complications and low grade fevers post-operatively if you had a general anesthetic.  You may want to hug a pillow when coughing and sneezing to add additional support to the surgical area, if you had abdominal or chest surgery, which will decrease pain during these times.  You are encouraged to walk and engage in light activity for the next two weeks.  You should not lift more than 20 pounds, until 4 to 6 weeks after surgery as it could put you at increased risk for complications.  Twenty pounds is roughly equivalent to a plastic bag of groceries. At that time- Listen to your body when lifting, if you have pain when lifting, stop and then try again in a few days. Soreness after doing exercises or activities of daily living is normal as you get back in to your normal routine.  MEDICATIONS:  Try to take narcotic medications and anti-inflammatory medications, such as tylenol, ibuprofen, naprosyn, etc., with food.  This will minimize stomach upset from the medication.  Should you develop nausea and vomiting from the pain medication, or develop a rash, please discontinue the medication and contact your physician.  You should not drive, make important decisions, or operate  machinery when taking narcotic pain medication.  SUNBLOCK Use sun block to incision area over the next year if this area will be exposed to sun. This helps decrease scarring and will allow you avoid a permanent darkened area over your incision.  QUESTIONS:  Please feel free to call our office if you have any questions, and we will be glad to assist you. [phone].

## 2020-03-14 NOTE — Progress Notes (Signed)
[facility] POST-OP OFFICE VISIT  03/14/2020  HPI: Louis Singh is a 44 y.o. male 24 days s/p Hartman's Procedure for perforated diverticulitis with Dr Aleen Campi whose post-operative course was complicated by midline wound infection.   Today, he reports he continues to make good improvements. He has abdominal soreness but this seems to be improving more and more. He is taking tylenol and ibuprofen and only needing the oxycodone intermittently. No fevers, nausea, emesis. He did have BBQ over the weekend and noticed some bloating but this resolved. Otherwise, bowel function has been more regular. His wife continues to help him pack his midline wound. This is getting smaller and requiring less packing. No issues with colostomy care.   Vital signs: BP 120/81   Pulse 82   Temp 98.4 F (36.9 C) (Oral)   Ht 5\' 10"  (1.778 m)   Wt 157 lb 3.2 oz (71.3 kg)   SpO2 98%   BMI 22.56 kg/m    Physical Exam: Constitutional: Well appearing male, NAD Abdomen: Soft, abdominal soreness, non-distended, no rebound/guarding. Colostomy in left mid-abdomen, pink, patent, gas and solid stool in bag. Skin: Inferior to his umbilicus the wound is healing via secondary intention. The wound bed is 100% granulation tissue. No erythema or drainage present. The remaining portions of his laparotomy are well healed.   Assessment/Plan: This is a 44 y.o. male 24 days s/p Hartman's Procedure for perforated diverticulitis   - I will provide a refill of oxycodone, 5 mg, 20 pills, for prn pain control. He will continue tylenol and ibuprofen prn  - Continue wound care; packing daily. Okay to shower  - continue colostomy care; monitor output  - complete 6 weeks total of lifting restrictions  - rtc in 2 weeks for wound re-check   -- 47, PA-C [facility] 03/14/2020, 10:26 AM [phone] M-F: 7am - 4pm

## 2020-03-22 ENCOUNTER — Telehealth: Payer: Self-pay | Admitting: *Deleted

## 2020-03-22 NOTE — Telephone Encounter (Signed)
Faxed return to work information to [employer] at [phone]

## 2020-03-29 ENCOUNTER — Ambulatory Visit (INDEPENDENT_AMBULATORY_CARE_PROVIDER_SITE_OTHER): Payer: Self-pay | Admitting: Surgery

## 2020-03-29 ENCOUNTER — Encounter: Payer: Self-pay | Admitting: Surgery

## 2020-03-29 ENCOUNTER — Other Ambulatory Visit: Payer: Self-pay

## 2020-03-29 VITALS — BP 106/73 | HR 75 | Temp 98.1°F | Ht 70.5 in | Wt 160.0 lb

## 2020-03-29 DIAGNOSIS — K5720 Diverticulitis of large intestine with perforation and abscess without bleeding: Secondary | ICD-10-CM

## 2020-03-29 DIAGNOSIS — Z09 Encounter for follow-up examination after completed treatment for conditions other than malignant neoplasm: Secondary | ICD-10-CM

## 2020-03-29 NOTE — Patient Instructions (Addendum)
Keep your opening for your bag as small as possible to go around your stoma. This will decrease the amount of irritation to your skin.  You may use the barrier wipe to help protect the skin.   We treated the areas around your stoma with silver nitrate. You may notice some grey/black around those areas. That is normal and will wear off.   You can use a much smaller dressing on your wound. You may continue the damp/wet dressing over the wound.   We will have you follow up here in 1 month.   May return to work the first part of January.

## 2020-03-29 NOTE — Progress Notes (Signed)
03/29/2020  HPI: Louis Singh is a 44 y.o. male s/p Hartmann's procedure on 10/14 for perforated diverticulitis with feculent peritonitis.  He developed a midline wound infection requiring removal of staples and packing of the wound.  He presents today for follow up.  He reports the midline wound is doing very well, but he's having pain around the ostomy site and feels there's tissue protruding through which is very tender.  Vital signs: BP 106/73   Pulse 75   Temp 98.1 F (36.7 C)   Ht 5' 10.5" (1.791 m)   Wt 160 lb (72.6 kg)   SpO2 97%   BMI 22.63 kg/m    Physical Exam: Constitutional:  No acute distress Abdomen:  Soft, non-distended, with appropriate soreness to palpation.  Inferior portion of the midline wound is now flat, with healthy granulation tissue, measuring about 3 x 6 cm, no depth.  This was dressed with wet to dry gauze and tape.  The LLQ ostomy is healthy, with stool.  There is subcutaneous tissue that is protruding from both 3 o'clock and 9 o'clock areas.  Touching these causes tenderness and some oozing.  These were treated with silver nitrate.  New ostomy appliance was placed, with good fit around the stoma.  Assessment/Plan: This is a 44 y.o. male s/p Hartmann's procedure, with midline wound infection.  --Discussed with the patient that the midline wound is healing very well.  There is no depth to pack anymore, and now it needs just a moist/wet to dry gauze dressing change daily.  This will continue healing and getting smaller. --It appears some subcutaneous tissue protruded on either side of the stoma, but there is no dehiscence noted in the mucocutaneous junction.  This was treated with silver nitrate to get it to scar down better.   --Instructed patient on making the appliance fit snugger around the stoma, to prevent any excoriation or exposure of the skin to stool. --Follow up in about a month to evaluate his progress and to discuss future plans for GI referral, etc.   Briefly discussed that my goal would be to wait 3-6 months prior to reversal, that he would need a colonoscopy prior to surgery, and that we would attempt to do the surgery laparoscopically, but could possibly need to be another open surgery given the peritonitis and potential scarring.   [name], MD [facility]

## 2020-04-17 ENCOUNTER — Telehealth: Payer: Self-pay | Admitting: *Deleted

## 2020-04-17 NOTE — Telephone Encounter (Signed)
Faxed FMLA on 04/14/20 to [employer] at [phone]

## 2020-05-01 ENCOUNTER — Encounter: Payer: Self-pay | Admitting: Surgery

## 2020-05-01 ENCOUNTER — Other Ambulatory Visit: Payer: Self-pay

## 2020-05-01 ENCOUNTER — Ambulatory Visit (INDEPENDENT_AMBULATORY_CARE_PROVIDER_SITE_OTHER): Payer: Self-pay | Admitting: Surgery

## 2020-05-01 VITALS — BP 116/74 | HR 92 | Temp 98.6°F | Ht 70.0 in | Wt 173.6 lb

## 2020-05-01 DIAGNOSIS — Z933 Colostomy status: Secondary | ICD-10-CM

## 2020-05-01 DIAGNOSIS — Z09 Encounter for follow-up examination after completed treatment for conditions other than malignant neoplasm: Secondary | ICD-10-CM

## 2020-05-01 DIAGNOSIS — K5720 Diverticulitis of large intestine with perforation and abscess without bleeding: Secondary | ICD-10-CM

## 2020-05-01 NOTE — Progress Notes (Signed)
05/01/2020  HPI: Louis Singh is a 44 y.o. male s/p Hartmann's procedure for perforated diverticulitis on 02/17/20.  He presents for follow up.  On his last visit, he had some subcutaneous tissue protruding from the mucocutaneous junction, and his midline wound had been healing well.  Today, he reports he's been doing well, without any pain or issues.  The area around the ostomy looks good, and the midline wound is almost healed over.  Vital signs: BP 116/74   Pulse 92   Temp 98.6 F (37 C) (Oral)   Ht 5\' 10"  (1.778 m)   Wt 173 lb 9.6 oz (78.7 kg)   SpO2 97%   BMI 24.91 kg/m    Physical Exam: Constitutional:  No acute distress Abdomen:  Soft, non-distended, non-tender to palpation.  Midline wound had one small area of about 5 mm of very superficial wound with surrounding scab tissue.  The ostomy is pink, healthy, without any protrusion.  Assessment/Plan: This is a 44 y.o. male s/p Hartmann's  --The patient continues to heal well, now more than 2 months out from his surgery.  He is able to resume normal activities and go back to work without any further restrictions.   --Discussed with him that we would send a referral to GI for colonoscopy in March in preparation for his ostomy reversal.  He has prior visits with Dr. April, so we'll send referral to him. --Follow up in March after colonoscopy to discuss surgery and schedule.   April, MD [facility]

## 2020-05-01 NOTE — Patient Instructions (Signed)
A referral has been placed with Dr. Norma Fredrickson at [facility]. They will call you for an appointment. We will see you back after your colonoscopy in March 2022. If you have any concerns or questions, please feel free to call our office.

## 2020-05-02 ENCOUNTER — Telehealth: Payer: Self-pay

## 2020-05-02 NOTE — Telephone Encounter (Signed)
Faxed notes, demographics, and insurance information regarding referral to Dr. Norma Fredrickson at [facility]. Fax number is [phone].

## 2020-05-08 ENCOUNTER — Telehealth: Payer: Self-pay | Admitting: *Deleted

## 2020-05-08 NOTE — Telephone Encounter (Signed)
Faxed FMLA to [employer] at [phone] 

## 2020-07-10 ENCOUNTER — Telehealth: Payer: Self-pay | Admitting: *Deleted

## 2020-07-10 NOTE — Telephone Encounter (Signed)
LVM for pt to call the clinic

## 2020-07-10 NOTE — Telephone Encounter (Signed)
Patient called and stated that we referred to Dr Norma Fredrickson to have a colonoscopy and the consultation is on the 31st and they it will take a month or so to do the colonoscopy. Patient wants to know if we can refer him some where else that could get him in sooner. Please call and advise

## 2020-07-12 ENCOUNTER — Encounter: Payer: Self-pay | Admitting: *Deleted

## 2020-07-12 ENCOUNTER — Other Ambulatory Visit: Payer: Self-pay

## 2020-07-12 DIAGNOSIS — K5720 Diverticulitis of large intestine with perforation and abscess without bleeding: Secondary | ICD-10-CM

## 2020-07-12 NOTE — Telephone Encounter (Signed)
Spoke with Louis Singh and he explained to me that Dr. Norma Fredrickson cannot see him until March 31st for a consultation and another month for a colonoscopy. He expressed his concern about having perforated diverticulitis and wanted it done sooner. He wanted a different referral. I placed referral to [facility]. He verbalized understanding.

## 2020-07-24 ENCOUNTER — Other Ambulatory Visit: Payer: Self-pay

## 2020-07-24 ENCOUNTER — Ambulatory Visit (INDEPENDENT_AMBULATORY_CARE_PROVIDER_SITE_OTHER): Payer: Managed Care, Other (non HMO) | Admitting: Gastroenterology

## 2020-07-24 ENCOUNTER — Encounter: Payer: Self-pay | Admitting: Gastroenterology

## 2020-07-24 DIAGNOSIS — K5720 Diverticulitis of large intestine with perforation and abscess without bleeding: Secondary | ICD-10-CM | POA: Diagnosis not present

## 2020-07-24 NOTE — Progress Notes (Signed)
Louis Singh [address]  [address]  [place], [place] [address]  Main: [phone]  Fax: [phone]   Gastroenterology Consultation  Referring Provider:     Marisue Ivan, MD Primary Care Physician:  Louis Ivan, MD Reason for Consultation:     Diverticulitis        HPI:    Chief Complaint  Patient presents with  . New Patient (Initial Visit)    Discuss colonoscopy due to colostomy    Louis Singh is a 45 y.o. y/o male referred for consultation & management  by Dr. Marisue Ivan, MD.  Patient with episodes of recurrent diverticulitis, dating back to 2010.  Had a complicated diverticulitis episode in October 2021 with perforation, requiring surgery with sigmoid colectomy, colostomy, Hartmann pouch.  Surgery is requesting colonoscopy prior to colostomy reversal.  Patient reports that his abdominal symptoms that occurred at the time of his diverticulitis have completely resolved.  Reports good appetite.  No nausea or vomiting.  No fever or chills.  Empties his back to 3 times a day.  No blood in colostomy.  No family history of colon cancer.  Past Medical History:  Diagnosis Date  . Diverticulosis   . Tobacco abuse     Past Surgical History:  Procedure Laterality Date  . COLECTOMY WITH COLOSTOMY CREATION/HARTMANN PROCEDURE N/A 02/17/2020   Procedure: COLECTOMY WITH COLOSTOMY CREATION/HARTMANN PROCEDURE;  Surgeon: Henrene Dodge, MD;  Location: [facility] ORS;  Service: General;  Laterality: N/A;    Prior to Admission medications   Not on File    Family History  Problem Relation Age of Onset  . Diabetes Mellitus II Father   . Diverticulitis Sister      Social History   Tobacco Use  . Smoking status: Former Smoker    Quit date: 05/06/2016    Years since quitting: 4.2  . Smokeless tobacco: Never Used  Vaping Use  . Vaping Use: Former  Substance Use Topics  . Alcohol use: Yes  . Drug use: No    Allergies as of 07/24/2020 - Review  Complete 07/24/2020  Allergen Reaction Noted  . Ciprofloxacin Other (See Comments) 01/14/2018  . Metronidazole Other (See Comments) 01/14/2018  . Shellfish allergy Nausea And Vomiting and Other (See Comments) 03/15/2014    Review of Systems:    All systems reviewed and negative except where noted in HPI.   Physical Exam:  BP 112/73   Pulse 80   Temp 98.4 F (36.9 C) (Temporal)   Ht 5\' 10"  (1.778 m)   Wt 175 lb 6.4 oz (79.6 kg)   BMI 25.17 kg/m  No LMP for male patient. Psych:  Alert and cooperative. Normal mood and affect. General:   Alert,  Well-developed, well-nourished, pleasant and cooperative in NAD Head:  Normocephalic and atraumatic. Eyes:  Sclera clear, no icterus.   Conjunctiva pink. Ears:  Normal auditory acuity. Nose:  No deformity, discharge, or lesions. Mouth:  No deformity or lesions,oropharynx pink & moist. Neck:  Supple; no masses or thyromegaly. Abdomen:  Normal bowel sounds.  No bruits.  Soft, non-tender and non-distended without masses, hepatosplenomegaly or hernias noted.  No guarding or rebound tenderness.    Colostomy stoma appears healthy and pink, with no erythema, breakdown.  Brown stool in bag.  No blood. Msk:  Symmetrical without gross deformities. Good, equal movement & strength bilaterally. Pulses:  Normal pulses noted. Extremities:  No clubbing or edema.  No cyanosis. Neurologic:  Alert and oriented x3;  grossly normal neurologically. Skin:  Intact without significant lesions or rashes. No jaundice. Lymph Nodes:  No significant cervical adenopathy. Psych:  Alert and cooperative. Normal mood and affect.   Labs: CBC    Component Value Date/Time   WBC 14.1 (H) 02/23/2020 0806   RBC 3.73 (L) 02/23/2020 0806   HGB 11.2 (L) 02/23/2020 0806   HCT 33.3 (L) 02/23/2020 0806   PLT 556 (H) 02/23/2020 0806   MCV 89.3 02/23/2020 0806   MCH 30.0 02/23/2020 0806   MCHC 33.6 02/23/2020 0806   RDW 13.4 02/23/2020 0806   CMP     Component Value  Date/Time   NA 145 02/20/2020 0429   K 4.0 02/20/2020 0429   CL 109 02/20/2020 0429   CO2 29 02/20/2020 0429   GLUCOSE 128 (H) 02/20/2020 0429   BUN 17 02/20/2020 0429   CREATININE 0.91 02/20/2020 0429   CALCIUM 8.1 (L) 02/20/2020 0429   PROT 8.1 02/15/2020 0805   ALBUMIN 3.8 02/15/2020 0805   AST 17 02/15/2020 0805   ALT 17 02/15/2020 0805   ALKPHOS 79 02/15/2020 0805   BILITOT 0.6 02/15/2020 0805   GFRNONAA >60 02/20/2020 0429   GFRAA >60 07/17/2015 1124    Imaging Studies: No results found.  Assessment and Plan:   [name] is a 45 y.o. y/o male has been referred for need for colonoscopy prior to colostomy reversal  I will repeat his CT scan to ensure no abnormalities present that would make for a increased risk colonoscopy  If no obvious pathology present in the CT scan, proceed with colonoscopy subsequently  I have discussed alternative options, risks & benefits,  which include, but are not limited to, bleeding, infection, perforation,respiratory complication & drug reaction.  The patient agrees with this plan & written consent will be obtained.     Dr Louis Singh  Speech recognition software was used to dictate the above note.

## 2020-08-07 ENCOUNTER — Other Ambulatory Visit: Payer: Self-pay

## 2020-08-07 ENCOUNTER — Ambulatory Visit
Admission: RE | Admit: 2020-08-07 | Discharge: 2020-08-07 | Disposition: A | Discharge status: Home / Self Care | Payer: Managed Care, Other (non HMO) | Source: Ambulatory Visit | Attending: Gastroenterology | Admitting: Gastroenterology

## 2020-08-07 DIAGNOSIS — K5720 Diverticulitis of large intestine with perforation and abscess without bleeding: Secondary | ICD-10-CM | POA: Insufficient documentation

## 2020-08-07 MED ORDER — IOHEXOL 300 MG/ML  SOLN
100.0000 mL | Freq: Once | INTRAMUSCULAR | Status: AC | PRN
  Administered 2020-08-07: 100 mL via INTRAVENOUS

## 2020-08-08 ENCOUNTER — Telehealth: Payer: Self-pay | Admitting: Gastroenterology

## 2020-08-08 NOTE — Telephone Encounter (Signed)
Patient called asking for call back to discuss results from CT scan.

## 2020-08-10 NOTE — Telephone Encounter (Signed)
Patient called to ask for call back to discuss results

## 2020-08-11 ENCOUNTER — Telehealth: Payer: Self-pay | Admitting: Gastroenterology

## 2020-08-11 NOTE — Telephone Encounter (Signed)
Patient LVM wants CT results stat, so he can schedule his colonoscopy prior to his surgical visit on 08/21/2020

## 2020-08-14 ENCOUNTER — Other Ambulatory Visit: Payer: Self-pay

## 2020-08-14 DIAGNOSIS — K5720 Diverticulitis of large intestine with perforation and abscess without bleeding: Secondary | ICD-10-CM

## 2020-08-14 MED ORDER — NA SULFATE-K SULFATE-MG SULF 17.5-3.13-1.6 GM/177ML PO SOLN
1.0000 | Freq: Once | ORAL | 0 refills | Status: AC
Start: 2020-08-14 — End: 2020-08-14

## 2020-08-15 ENCOUNTER — Telehealth: Payer: Self-pay

## 2020-08-15 DIAGNOSIS — K5720 Diverticulitis of large intestine with perforation and abscess without bleeding: Secondary | ICD-10-CM

## 2020-08-15 DIAGNOSIS — J90 Pleural effusion, not elsewhere classified: Secondary | ICD-10-CM

## 2020-08-15 MED ORDER — NA SULFATE-K SULFATE-MG SULF 17.5-3.13-1.6 GM/177ML PO SOLN: ORAL | 0 refills | Status: DC

## 2020-08-15 NOTE — Telephone Encounter (Signed)
Called patient to let him know what his results were and what Dr. Maximino Greenland recommended. Patient will have his colonoscopy done on 08/18/2020 with Dr. Maximino Greenland. I will send him his instructions again via MyChart and prescription sent to his pharmacy. Patient understood and had no further questions.

## 2020-08-15 NOTE — Telephone Encounter (Signed)
Called patient back to let him know what his CT Scan showed per Dr. Maximino Greenland. Patient understood and schedule his procedure for 08/18/2020 at [facility]. Patient was also told that he had a pleural effusion and that he needed to be referred to a pulmonologist for a pleural effusion that was still seen on his CT scan. I will also call his PCP to let him know of the CT Scan results. Patient had no further questions.

## 2020-08-16 ENCOUNTER — Telehealth: Payer: Managed Care, Other (non HMO)

## 2020-08-18 ENCOUNTER — Ambulatory Visit: Payer: Managed Care, Other (non HMO) | Admitting: Anesthesiology

## 2020-08-18 ENCOUNTER — Ambulatory Visit
Admission: RE | Admit: 2020-08-18 | Discharge: 2020-08-18 | Disposition: A | Discharge status: Home / Self Care | Payer: Managed Care, Other (non HMO) | Attending: Gastroenterology | Admitting: Gastroenterology

## 2020-08-18 ENCOUNTER — Encounter
Admission: RE | Disposition: A | Discharge status: Home / Self Care | Payer: Self-pay | Source: Home / Self Care | Attending: Gastroenterology

## 2020-08-18 DIAGNOSIS — K5720 Diverticulitis of large intestine with perforation and abscess without bleeding: Secondary | ICD-10-CM | POA: Diagnosis not present

## 2020-08-18 DIAGNOSIS — K5730 Diverticulosis of large intestine without perforation or abscess without bleeding: Secondary | ICD-10-CM | POA: Insufficient documentation

## 2020-08-18 DIAGNOSIS — Z87891 Personal history of nicotine dependence: Secondary | ICD-10-CM | POA: Diagnosis not present

## 2020-08-18 DIAGNOSIS — Z933 Colostomy status: Secondary | ICD-10-CM | POA: Diagnosis not present

## 2020-08-18 DIAGNOSIS — Z881 Allergy status to other antibiotic agents status: Secondary | ICD-10-CM | POA: Insufficient documentation

## 2020-08-18 DIAGNOSIS — Z9049 Acquired absence of other specified parts of digestive tract: Secondary | ICD-10-CM | POA: Diagnosis not present

## 2020-08-18 DIAGNOSIS — K6389 Other specified diseases of intestine: Secondary | ICD-10-CM

## 2020-08-18 DIAGNOSIS — Z01818 Encounter for other preprocedural examination: Secondary | ICD-10-CM | POA: Diagnosis not present

## 2020-08-18 HISTORY — PX: COLONOSCOPY WITH PROPOFOL: SHX5780

## 2020-08-18 SURGERY — COLONOSCOPY WITH PROPOFOL
Anesthesia: General

## 2020-08-18 MED ORDER — LIDOCAINE HCL (CARDIAC) PF 100 MG/5ML IV SOSY
PREFILLED_SYRINGE | INTRAVENOUS | Status: DC | PRN
  Administered 2020-08-18: 60 mg via INTRAVENOUS

## 2020-08-18 MED ORDER — PHENYLEPHRINE HCL (PRESSORS) 10 MG/ML IV SOLN
INTRAVENOUS | Status: DC | PRN
  Administered 2020-08-18: 100 ug via INTRAVENOUS

## 2020-08-18 MED ORDER — MIDAZOLAM HCL 2 MG/2ML IJ SOLN
INTRAMUSCULAR | Status: DC | PRN
  Administered 2020-08-18 (×2): 2 mg via INTRAVENOUS

## 2020-08-18 MED ORDER — MIDAZOLAM HCL 2 MG/2ML IJ SOLN
INTRAMUSCULAR | Status: AC
  Filled 2020-08-18: qty 2

## 2020-08-18 MED ORDER — PROPOFOL 500 MG/50ML IV EMUL
INTRAVENOUS | Status: DC | PRN
  Administered 2020-08-18: 50 ug/kg/min via INTRAVENOUS

## 2020-08-18 MED ORDER — FENTANYL CITRATE (PF) 100 MCG/2ML IJ SOLN
INTRAMUSCULAR | Status: AC
  Filled 2020-08-18: qty 2

## 2020-08-18 MED ORDER — LIDOCAINE HCL (PF) 2 % IJ SOLN
INTRAMUSCULAR | Status: AC
  Filled 2020-08-18: qty 5

## 2020-08-18 MED ORDER — SODIUM CHLORIDE 0.9 % IV SOLN
INTRAVENOUS | Status: DC
  Administered 2020-08-18: 20 mL/h via INTRAVENOUS

## 2020-08-18 MED ORDER — PROPOFOL 500 MG/50ML IV EMUL
INTRAVENOUS | Status: AC
  Filled 2020-08-18: qty 50

## 2020-08-18 MED ORDER — PROPOFOL 10 MG/ML IV BOLUS
INTRAVENOUS | Status: DC | PRN
  Administered 2020-08-18: 30 mg via INTRAVENOUS
  Administered 2020-08-18: 20 mg via INTRAVENOUS

## 2020-08-18 MED ORDER — FENTANYL CITRATE (PF) 100 MCG/2ML IJ SOLN
INTRAMUSCULAR | Status: DC | PRN
  Administered 2020-08-18 (×2): 50 ug via INTRAVENOUS

## 2020-08-18 NOTE — Anesthesia Preprocedure Evaluation (Addendum)
Anesthesia Evaluation  Patient identified by MRN, date of birth, ID band Patient awake    Reviewed: Allergy & Precautions, H&P , NPO status , Patient's Chart, lab work & pertinent test results  History of Anesthesia Complications Negative for: history of anesthetic complications  Airway Mallampati: I  TM Distance: >3 FB Neck ROM: full    Dental  (+) Teeth Intact   Pulmonary neg sleep apnea, neg COPD, former smoker,  Has "small amount of fluid on lung" since his ex lap, sees Pulmonololgy next week.  Minimal SOB with exertion   breath sounds clear to auscultation       Cardiovascular (-) angina(-) Past MI and (-) Cardiac Stents negative cardio ROS  (-) dysrhythmias  Rhythm:regular Rate:Normal     Neuro/Psych PSYCHIATRIC DISORDERS Anxiety negative neurological ROS     GI/Hepatic Neg liver ROS, H/o intestinal perforation, ostomy   Endo/Other  negative endocrine ROS  Renal/GU negative Renal ROS  negative genitourinary   Musculoskeletal   Abdominal   Peds  Hematology negative hematology ROS (+)   Anesthesia Other Findings Past Medical History: No date: Diverticulosis No date: Tobacco abuse  Past Surgical History: 02/17/2020: COLECTOMY WITH COLOSTOMY CREATION/HARTMANN PROCEDURE; N/A     Comment:  Procedure: COLECTOMY WITH COLOSTOMY CREATION/HARTMANN               PROCEDURE;  Surgeon: Henrene Dodge, MD;  Location: [facility]               ORS;  Service: General;  Laterality: N/A;  BMI    Body Mass Index: 23.68 kg/m      Reproductive/Obstetrics negative OB ROS                            Anesthesia Physical Anesthesia Plan  ASA: II  Anesthesia Plan: General   Post-op Pain Management:    Induction:   PONV Risk Score and Plan: Propofol infusion and TIVA  Airway Management Planned: Nasal Cannula  Additional Equipment:   Intra-op Plan:   Post-operative Plan:   Informed Consent: I  have reviewed the patients History and Physical, chart, labs and discussed the procedure including the risks, benefits and alternatives for the proposed anesthesia with the patient or authorized representative who has indicated his/her understanding and acceptance.     Dental Advisory Given  Plan Discussed with: Anesthesiologist, CRNA and Surgeon  Anesthesia Plan Comments:        Anesthesia Quick Evaluation

## 2020-08-18 NOTE — Anesthesia Postprocedure Evaluation (Signed)
Anesthesia Post Note  Patient: Louis Singh  Procedure(s) Performed: COLONOSCOPY WITH PROPOFOL (N/A )  Patient location during evaluation: PACU Anesthesia Type: General Level of consciousness: awake and alert Pain management: pain level controlled Vital Signs Assessment: post-procedure vital signs reviewed and stable Respiratory status: spontaneous breathing, nonlabored ventilation and respiratory function stable Cardiovascular status: blood pressure returned to baseline and stable Postop Assessment: no apparent nausea or vomiting Anesthetic complications: no   No complications documented.   Last Vitals:  Vitals:   08/18/20 1301 08/18/20 1311  BP: 114/79 105/79  Pulse: 85 70  Resp: 19 16  Temp:    SpO2: 100% 100%    Last Pain:  Vitals:   08/18/20 1311  TempSrc:   PainSc: 0-No pain                 Aurelio Brash Dimple Bastyr

## 2020-08-18 NOTE — Op Note (Signed)
[facility] Patient Name: Louis PortelaWilliam Singh Procedure Date: 08/18/2020 11:55 AM MRN: [id number] Account #: [id number] Date of Birth: 04/14/1976 Admit Type: Outpatient Age: 45 [facility]OM 2 Gender: Male Note Status: Finalized Procedure:             Colonoscopy via Stoma with Endoscopy of Hartmann Pouch Indications:           Preoperative assessment for colostomy take-down,                         History of diverticulitis Providers:             Bernadetta Roell B. Maximino Greenlandahiliani MD, MD Referring MD:          Marisue IvanKanhka Linthavong (Referring MD) Medicines:             General Anesthesia Complications:         No immediate complications. Procedure:             Pre-Anesthesia Assessment:                        - Prior to the procedure, a History and Physical was                         performed, and patient medications and allergies were                         reviewed. The patient is competent. The risks and                         benefits of the procedure and the sedation options and                         risks were discussed with the patient. All questions                         were answered and informed consent was obtained.                         Patient identification and proposed procedure were                         verified by the physician, the nurse, the                         anesthesiologist, the anesthetist and the technician                         in the pre-procedure area in the procedure room in the                         endoscopy suite. Mental Status Examination: alert and                         oriented. Airway Examination: normal oropharyngeal                         airway and neck mobility. Respiratory Examination:  clear to auscultation. CV Examination: normal.                         Prophylactic Antibiotics: The patient does not require                         prophylactic antibiotics. Prior  Anticoagulants: The                         patient has taken no previous anticoagulant or                         antiplatelet agents. ASA Grade Assessment: II - A                         patient with mild systemic disease. After reviewing                         the risks and benefits, the patient was deemed in                         satisfactory condition to undergo the procedure. The                         anesthesia plan was to use general anesthesia.                         Immediately prior to administration of medications,                         the patient was re-assessed for adequacy to receive                         sedatives. The heart rate, respiratory rate, oxygen                         saturations, blood pressure, adequacy of pulmonary                         ventilation, and response to care were monitored                         throughout the procedure. The physical status of the                         patient was re-assessed after the procedure.                        The Colonoscope was introduced through the descending                         colostomy and advanced to the the cecum, identified by                         appendiceal orifice and ileocecal valve. The                         Colonoscope was introduced through the anus and  advanced to the [facility] pouch. The procedure was                         performed with ease. The patient tolerated the                         procedure well. The quality of the bowel preparation                         was good during the exam via colostomy. Mucus balls                         present in the hartmann's pouch. Findings:      A single small-mouthed diverticulum was found in the descending colon.      The exam was otherwise normal throughout the examined colon.      A patchy area of mildly erythematous mucosa was found in the [employer].      Anal papilla(e) were  hypertrophied. Impression:            - Diverticulosis in the descending colon.                        - Erythematous mucosa in the Hartmann pouch.                        - No specimens collected.                        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 Mucus balls were                         also present in the pouch Recommendation:        - Proceed with plans for colostomy reveral as per                         primary surgeon                        - Follow up in GI clinic 6-12 months post surgery to                         discuss future screening colonoscopies or earlier if                         any new symptoms occur before then                        - Continue present medications.                        - The findings and recommendations were discussed with                         the patient.                        -  Return to primary care physician in 4 weeks.                        - Resume regular diet. Procedure Code(s):     --- Professional ---                        [phone], Colonoscopy through stoma; diagnostic,                         including collection of specimen(s) by brushing or                         washing, when performed (separate procedure)                        45330, Sigmoidoscopy, flexible; diagnostic, including                         collection of specimen(s) by brushing or washing, when                         performed (separate procedure) Diagnosis Code(s):     --- Professional ---                        K63.89, Other specified diseases of intestine                        Z01.818, Encounter for other preprocedural examination                        Z93.3, Colostomy status                        K57.32, Diverticulitis of large intestine without                         perforation or abscess without bleeding CPT copyright 2019 American Medical Association. All rights  reserved. The codes documented in this report are preliminary and upon coder review may  be revised to meet current compliance requirements. Attending Participation:      I personally performed the entire procedure.  [name], MD Michel Bickers B. Maximino Greenland MD, MD 08/18/2020 12:55:16 PM This report has been signed electronically. Number of Addenda: 0 Note Initiated On: 08/18/2020 11:55 AM Scope Withdrawal Time: 0 hours 18 minutes 40 seconds  Total Procedure Duration: 0 hours 25 minutes 37 seconds  Estimated Blood Loss:  Estimated blood loss: none.      [facility]

## 2020-08-18 NOTE — H&P (Signed)
  [name], MD [address], [place], [place], [address] [address], [address], [place], [place], [address] Phone: [phone]  Fax: [phone]  Primary Care Physician:  Marisue Ivan, MD   Pre-Procedure History & Physical: HPI:  Louis Singh is a 45 y.o. male is here for a colonoscopy.   Past Medical History:  Diagnosis Date  . Diverticulosis   . Tobacco abuse     Past Surgical History:  Procedure Laterality Date  . COLECTOMY WITH COLOSTOMY CREATION/HARTMANN PROCEDURE N/A 02/17/2020   Procedure: COLECTOMY WITH COLOSTOMY CREATION/HARTMANN PROCEDURE;  Surgeon: Henrene Dodge, MD;  Location: [facility];  Service: General;  Laterality: N/A;    Prior to Admission medications   Medication Sig Start Date End Date Taking? Authorizing Provider  Na Sulfate-K Sulfate-Mg Sulf 17.5-3.13-1.6 GM/177ML SOLN At 5 PM the day before procedure take 1 bottle and 5 hours before procedure take 1 bottle. 08/15/20  Yes Pasty Spillers, MD    Allergies as of 08/15/2020 - Review Complete 08/07/2020  Allergen Reaction Noted  . Ciprofloxacin Other (See Comments) 01/14/2018  . Metronidazole Other (See Comments) 01/14/2018  . Shellfish allergy Nausea And Vomiting and Other (See Comments) 03/15/2014    Family History  Problem Relation Age of Onset  . Diabetes Mellitus II Father   . Diverticulitis Sister     Social History   Socioeconomic History  . Marital status: Married    Spouse name: Not on file  . Number of children: Not on file  . Years of education: Not on file  . Highest education level: Not on file  Occupational History  . Not on file  Tobacco Use  . Smoking status: Former Smoker    Quit date: 05/06/2016    Years since quitting: 4.2  . Smokeless tobacco: Never Used  Vaping Use  . Vaping Use: Former  Substance and Sexual Activity  . Alcohol use: Yes  . Drug use: No  . Sexual activity: Not on file  Other Topics Concern  . Not on file  Social  History Narrative  . Not on file   Social Determinants of Health   Financial Resource Strain: Not on file  Food Insecurity: Not on file  Transportation Needs: Not on file  Physical Activity: Not on file  Stress: Not on file  Social Connections: Not on file  Intimate Partner Violence: Not on file    Review of Systems: See HPI, otherwise negative ROS  Physical Exam: BP 113/82   Pulse 86   Temp (!) 97 F (36.1 C)   Resp 18   Ht 5\' 10"  (1.778 m)   Wt 74.8 kg   SpO2 100%   BMI 23.68 kg/m  General:   Alert,  pleasant and cooperative in NAD Head:  Normocephalic and atraumatic. Neck:  Supple; no masses or thyromegaly. Lungs:  Clear throughout to auscultation, normal respiratory effort.    Heart:  +S1, +S2, Regular rate and rhythm, No edema. Abdomen:  Soft, nontender and nondistended. Normal bowel sounds, without guarding, and without rebound.   Neurologic:  Alert and  oriented x4;  grossly normal neurologically.  Impression/Plan: Louis Singh is here for a colonoscopy to be performed for diverticulitis  Risks, benefits, limitations, and alternatives regarding  colonoscopy have been reviewed with the patient.  Questions have been answered.  All parties agreeable.   Gretchen Portela, MD  08/18/2020, 11:07 AM

## 2020-08-18 NOTE — Transfer of Care (Signed)
Immediate Anesthesia Transfer of Care Note  Patient: Louis Singh  Procedure(s) Performed: COLONOSCOPY WITH PROPOFOL (N/A )  Patient Location: PACU  Anesthesia Type:General  Level of Consciousness: sedated  Airway & Oxygen Therapy: Patient Spontanous Breathing and Patient connected to nasal cannula oxygen  Post-op Assessment: Report given to RN and Post -op Vital signs reviewed and stable  Post vital signs: Reviewed and stable  Last Vitals:  Vitals Value Taken Time  BP 114/75 08/18/20 1241  Temp    Pulse 60 08/18/20 1241  Resp 26 08/18/20 1241  SpO2 99 % 08/18/20 1241    Last Pain:  Vitals:   08/18/20 1026  PainSc: 0-No pain         Complications: No complications documented.

## 2020-08-21 ENCOUNTER — Ambulatory Visit: Payer: Self-pay | Admitting: Surgery

## 2020-08-21 ENCOUNTER — Other Ambulatory Visit: Payer: Self-pay | Admitting: Pulmonary Disease

## 2020-08-21 ENCOUNTER — Other Ambulatory Visit: Payer: Self-pay

## 2020-08-21 ENCOUNTER — Other Ambulatory Visit
Admission: RE | Admit: 2020-08-21 | Discharge: 2020-08-21 | Disposition: A | Discharge status: Home / Self Care | Payer: Managed Care, Other (non HMO) | Source: Ambulatory Visit | Attending: Pulmonary Disease | Admitting: Pulmonary Disease

## 2020-08-21 ENCOUNTER — Encounter: Payer: Self-pay | Admitting: Gastroenterology

## 2020-08-21 ENCOUNTER — Ambulatory Visit
Admission: RE | Admit: 2020-08-21 | Discharge: 2020-08-21 | Disposition: A | Discharge status: Home / Self Care | Payer: Managed Care, Other (non HMO) | Source: Ambulatory Visit | Attending: Pulmonary Disease | Admitting: Pulmonary Disease

## 2020-08-21 ENCOUNTER — Ambulatory Visit
Admission: RE | Admit: 2020-08-21 | Discharge: 2020-08-21 | Disposition: A | Discharge status: Home / Self Care | Payer: Managed Care, Other (non HMO) | Source: Ambulatory Visit | Attending: Interventional Radiology | Admitting: Interventional Radiology

## 2020-08-21 DIAGNOSIS — J90 Pleural effusion, not elsewhere classified: Secondary | ICD-10-CM

## 2020-08-21 DIAGNOSIS — Z20822 Contact with and (suspected) exposure to covid-19: Secondary | ICD-10-CM | POA: Insufficient documentation

## 2020-08-21 DIAGNOSIS — K7460 Unspecified cirrhosis of liver: Secondary | ICD-10-CM

## 2020-08-21 DIAGNOSIS — K769 Liver disease, unspecified: Secondary | ICD-10-CM

## 2020-08-21 DIAGNOSIS — K7581 Nonalcoholic steatohepatitis (NASH): Secondary | ICD-10-CM

## 2020-08-21 LAB — BODY FLUID CELL COUNT WITH DIFFERENTIAL
Eos, Fluid: 61 %
Lymphs, Fluid: 13 %
Monocyte-Macrophage-Serous Fluid: 21 %
Neutrophil Count, Fluid: 5 %
Total Nucleated Cell Count, Fluid: 4103 cu mm

## 2020-08-21 LAB — GLUCOSE, PLEURAL OR PERITONEAL FLUID: Glucose, Fluid: 106 mg/dL

## 2020-08-21 LAB — ALBUMIN, PLEURAL OR PERITONEAL FLUID: Albumin, Fluid: 3.1 g/dL

## 2020-08-21 LAB — PROTEIN, PLEURAL OR PERITONEAL FLUID: Total protein, fluid: 5.3 g/dL

## 2020-08-21 LAB — LACTATE DEHYDROGENASE, PLEURAL OR PERITONEAL FLUID: LD, Fluid: 210 U/L — ABNORMAL HIGH (ref 3–23)

## 2020-08-21 LAB — SARS CORONAVIRUS 2 BY RT PCR (HOSPITAL ORDER, PERFORMED IN ~~LOC~~ HOSPITAL LAB): SARS Coronavirus 2: NEGATIVE

## 2020-08-21 IMAGING — DX DG CHEST 1V PORT
2 series · 2 of 2 positions shown · non-contrast
Comparison: [DATE] CT

CLINICAL DATA: Status post right thoracentesis

EXAM:
PORTABLE CHEST 1 VIEW

[chest ap (1 of 2)]
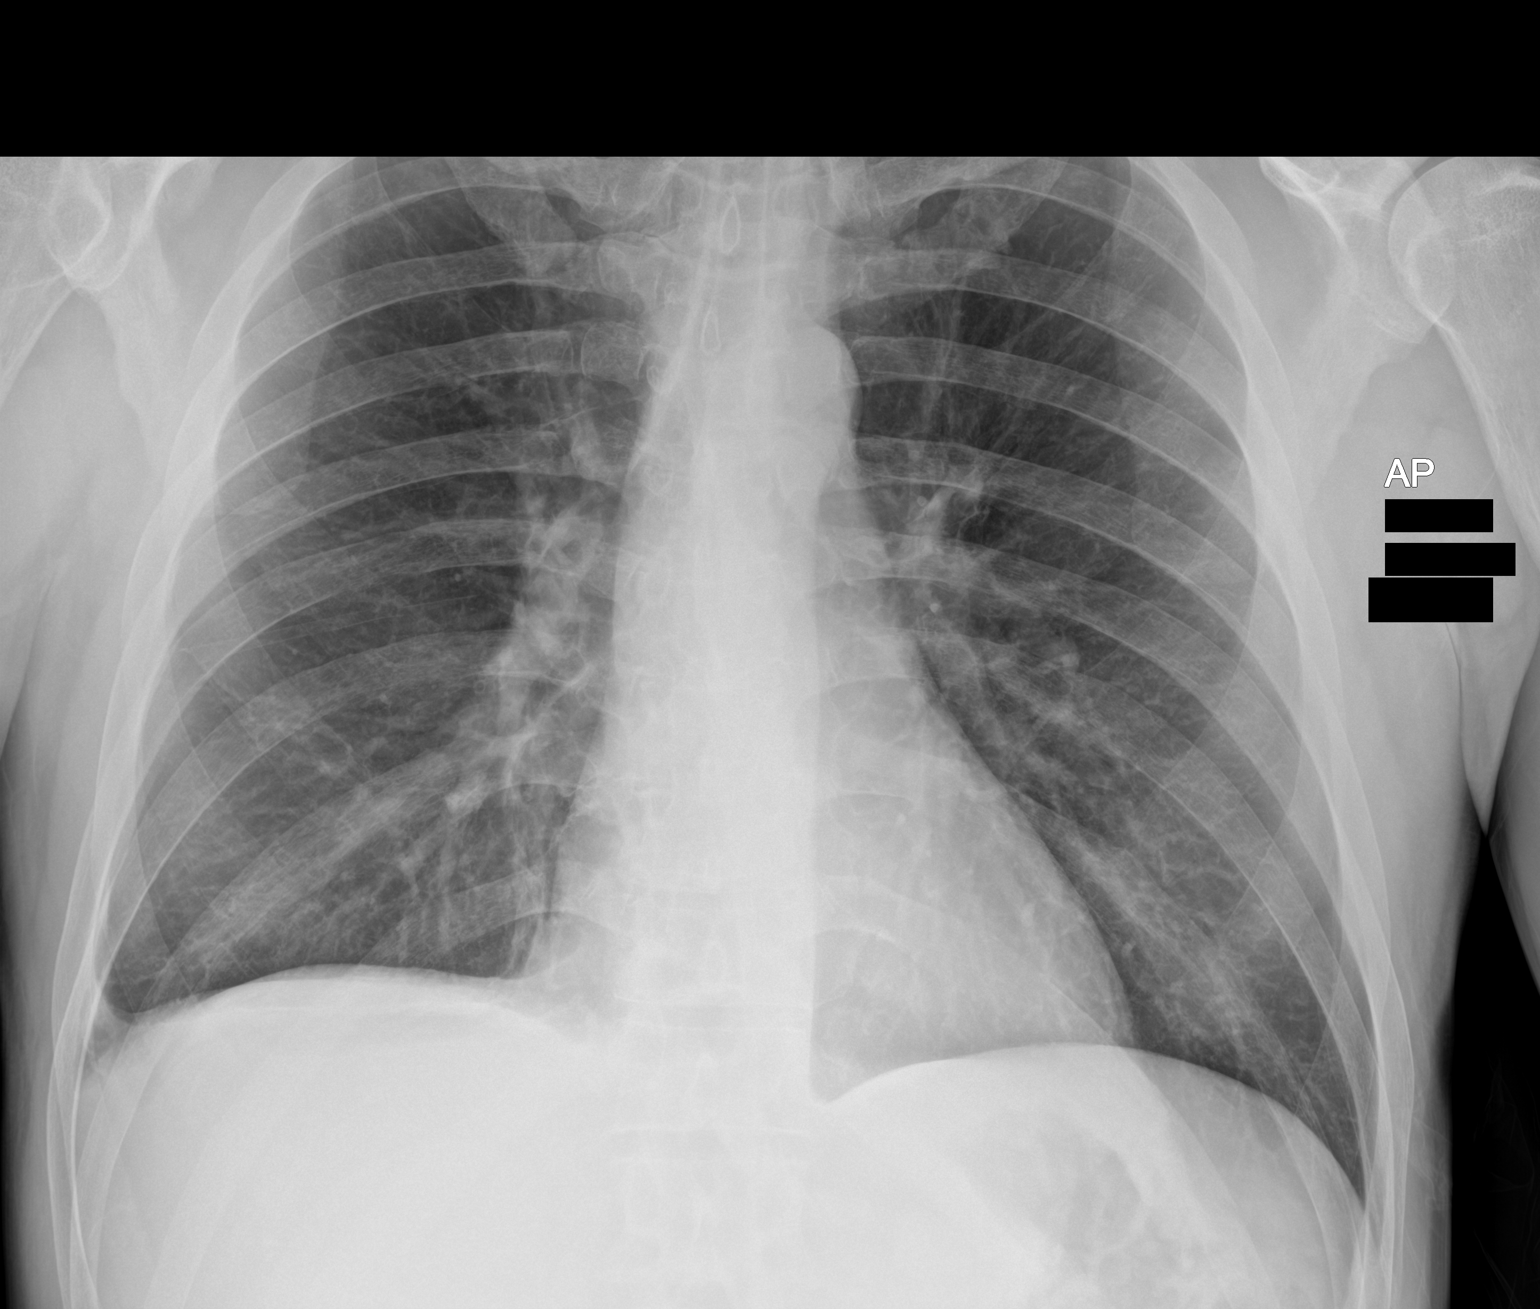

[chest ap (2 of 2)]
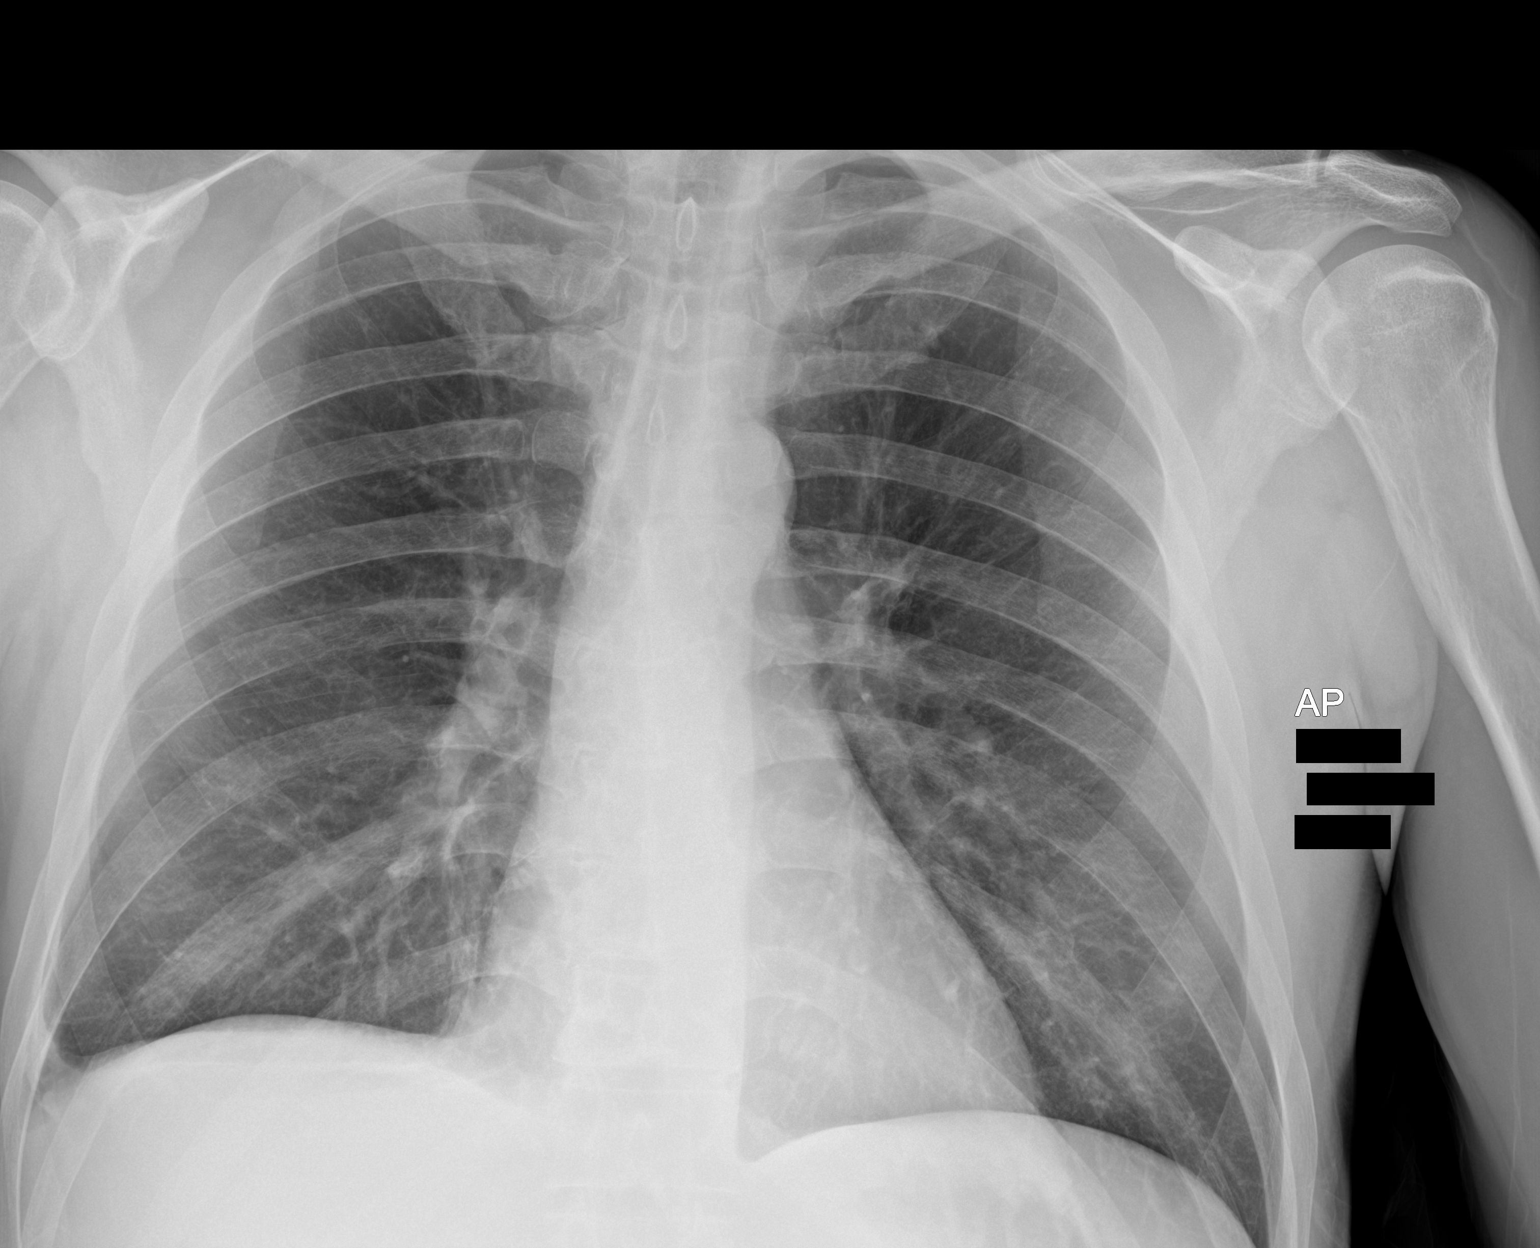

[2 of 2 positions shown; findings below may reference images not displayed]

FINDINGS: Resolution of the right effusion following thoracentesis. Minor
right base atelectasis. Otherwise no acute airspace process,
collapse or consolidation. Negative for edema. No pneumothorax.
Trachea midline. Normal heart size and vascularity.
IMPRESSION: No complicating feature following right thoracentesis. Negative for
pneumothorax.

## 2020-08-21 NOTE — Procedures (Signed)
Interventional Radiology Procedure Note  Procedure: [place] RT THORACENTESIS    Complications: None  Estimated Blood Loss:  0  Findings: 750 CC REMOVED LABS SENT    Sharen Counter, MD

## 2020-08-22 LAB — PH, BODY FLUID: pH, Body Fluid: 7.5

## 2020-08-22 LAB — ACID FAST SMEAR (AFB, MYCOBACTERIA): Acid Fast Smear: NEGATIVE

## 2020-08-23 ENCOUNTER — Other Ambulatory Visit: Payer: Self-pay

## 2020-08-23 ENCOUNTER — Ambulatory Visit: Payer: Managed Care, Other (non HMO) | Admitting: Surgery

## 2020-08-23 ENCOUNTER — Encounter: Payer: Self-pay | Admitting: Surgery

## 2020-08-23 VITALS — BP 116/74 | HR 73 | Temp 97.9°F | Ht 70.0 in | Wt 168.4 lb

## 2020-08-23 DIAGNOSIS — Z933 Colostomy status: Secondary | ICD-10-CM | POA: Diagnosis not present

## 2020-08-23 LAB — CYTOLOGY - NON PAP

## 2020-08-23 MED ORDER — METRONIDAZOLE 500 MG PO TABS: ORAL_TABLET | ORAL | 0 refills | Status: DC

## 2020-08-23 MED ORDER — POLYETHYLENE GLYCOL 3350 17 GM/SCOOP PO POWD: ORAL | 0 refills | Status: DC

## 2020-08-23 MED ORDER — NEOMYCIN SULFATE 500 MG PO TABS: 1000.0000 mg | ORAL_TABLET | Freq: Once | ORAL | 0 refills | Status: AC

## 2020-08-23 NOTE — Progress Notes (Signed)
Faxed pulmonary clearance to Dr. Fuad Aleskerov at [phone]. 

## 2020-08-23 NOTE — H&P (View-Only) (Signed)
08/23/2020  History of Present Illness: Louis Singh is a 44 y.o. male presenting for follow s/p Hartmann's procedure in 02/2020.  He saw Dr. Tahiliani with GI in 07/2020 and repeat CT scan was obtained, which did not show any issues intra-abdominally, but showed a persistent right pleural effusion.  He underwent colonoscopy on 08/18/20 without any significant findings, and he recently saw Dr. Aleskerov with Pulmonary medicine.  He had a right thoracentesis on 08/21/20 for 750 ml.  Cultures pending currently.    Today, patient reports that he feels much better after the thoracentesis.  His main symptom was discomfort in the right chest when he would raise his arm.  No he only has some soreness at the thoracentesis site itself.  Denies any abdominal discomfort or issues with the ostomy function.  Denies any fevers, chills, chest pain, shortness of breath, nausea, or vomiting.  Past Medical History: Past Medical History:  Diagnosis Date  . Diverticulosis   . Tobacco abuse      Past Surgical History: Past Surgical History:  Procedure Laterality Date  . COLECTOMY WITH COLOSTOMY CREATION/HARTMANN PROCEDURE N/A 02/17/2020   Procedure: COLECTOMY WITH COLOSTOMY CREATION/HARTMANN PROCEDURE;  Surgeon: Brannan Cassedy, MD;  Location: [facility];  Service: General;  Laterality: N/A;  . COLONOSCOPY WITH PROPOFOL N/A 08/18/2020   Procedure: COLONOSCOPY WITH PROPOFOL;  Surgeon: Tahiliani, Varnita B, MD;  Location: [facility];  Service: Endoscopy;  Laterality: N/A;    Home Medications: Prior to Admission medications   Medication Sig Start Date End Date Taking? Authorizing Provider  metroNIDAZOLE (FLAGYL) 500 MG tablet Take 2 tabs (1000 mg) by mouth 3 times a day 08/23/20  Yes Marisol Giambra, MD  neomycin (MYCIFRADIN) 500 MG tablet Take 2 tablets (1,000 mg total) by mouth once for 1 dose. 08/23/20 08/23/20 Yes Karlita Lichtman, MD  polyethylene glycol powder (MIRALAX) 17 GM/SCOOP powder Mix with 64 ounces of  Gatorade (no red) the day prior to surgery and drink as directed. 08/23/20  Yes Darrik Richman, MD    Allergies: Allergies  Allergen Reactions  . Ciprofloxacin Other (See Comments)    Intolerance - increased anxiety   . Metronidazole Other (See Comments)    Intolerance- increases anxiety  . Shellfish Allergy Nausea And Vomiting and Other (See Comments)    Review of Systems: Review of Systems  Constitutional: Negative for chills and fever.  HENT: Negative for hearing loss.   Respiratory: Negative for shortness of breath.   Cardiovascular: Negative for chest pain.  Gastrointestinal: Negative for abdominal pain, blood in stool, nausea and vomiting.  Genitourinary: Negative for dysuria.  Musculoskeletal: Negative for myalgias.  Skin: Negative for rash.  Neurological: Negative for dizziness.  Psychiatric/Behavioral: Negative for depression.    Physical Exam BP 116/74   Pulse 73   Temp 97.9 F (36.6 C) (Oral)   Ht 5' 10" (1.778 m)   Wt 168 lb 6.4 oz (76.4 kg)   SpO2 96%   BMI 24.16 kg/m  CONSTITUTIONAL: No acute distress, normal body habitus HEENT:  Normocephalic, atraumatic, extraocular motion intact. RESPIRATORY:  Lungs are clear, and breath sounds are equal bilaterally. Normal respiratory effort without pathologic use of accessory muscles. CARDIOVASCULAR: Heart is regular without murmurs, gallops, or rubs. GI: The abdomen is soft, non-distended, non-tender to palpation.  LLQ ostomy healthy, with gas/stool in bag. Midline incision well healed, with larger scar inferiorly consistent with prior open wound/infection.  No hernias. MUSCULOSKELETAL:  No peripheral edema or cyanosis.  Normal gait. SKIN:  No gross skin lesions. NEUROLOGIC:    Motor and sensation is grossly normal.  Cranial nerves are grossly intact. PSYCH:  Alert and oriented to person, place and time. Affect is normal.  Labs/Imaging: CT scan abdomen/pelvis 08/07/20: IMPRESSION: 1. No acute abnormality in the  abdomen/pelvis. 2. Descending colostomy without peristomal hernia. 3. Increased size of right pleural effusion, at least moderate.  Previous left pleural effusion has resolved.  U/S Right thoracentesis 08/21/20: IMPRESSION: Successful ultrasound guided right thoracentesis yielding 750 cc of pleural fluid.  Colonoscopy 08/18/20: IMPRESSION: - Diverticulosis in the descending colon. - Erythematous mucosa in the Hartmann pouch. - No specimens collected. 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 Mucus balls were also present in the pouch.   Assessment and Plan: This is a 45 y.o. male with history of perforated diverticulitis, s/p Hartmann's procedure on 02/17/20.  --Patient had colonoscopy on 4/15 which did not show any suspicious findings and he can proceed with colostomy reversal.  He had persistent to increased right pleural effusion on recent CT scan, and he underwent right thoracentesis on 4/18 for 750 ml of fluid drained.  He feels much better today and feels ready for surgery. --Discussed with the patient the role for minimally invasive, robotic colostomy takedown.  Reviewed with him the risks of bleeding, infection, injury to surrounding structures, the possibility of converting to open surgery, the possibility of needing a diverting loop ileostomy, hospital course, post-op recovery, and he's willing to proceed. --Given his pleural effusion, he is scheduled for echocardiogram as ordered by Dr. Karna Christmas, which is being done today.  Will send surgical clearance form to Dr. Karna Christmas as well. --Tentatively will schedule him for surgery on 09/12/20.  Discussed with him that we would ask Urology to assist with cystoscopy and ICG injection into his ureters for better identification intraoperatively.  All questions have been answered.  Face-to-face time spent with the patient and care providers was 40 minutes, with more than 50% of the time spent  counseling, educating, and coordinating care of the patient.     [name], MD [facility]

## 2020-08-23 NOTE — Patient Instructions (Addendum)
Our surgery scheduler Britta Mccreedy will call you within 24-48 hours to get you scheduled. If you have not heard from her after 48 hours, please call our office. You will need to get Covid tested before surgery and have the blue sheet available when she calls to write down important information. If you have any concerns or questions, please feel free to call our office.    Colostomy Reversal Surgery, Care After This sheet gives you information about how to care for yourself after your procedure. Your health care provider may also give you more specific instructions. If you have problems or questions, contact your health care provider. What can I expect after the procedure? After the procedure, it is common to have:  Pain and discomfort in your abdomen, especially near your incision.  Loose stools (diarrhea).  Decreased appetite.  Constipation. Follow these instructions at home: Activity  Do not lift anything that is heavier than 10 lb (4.5 kg), or the limit that you are told, until your health care provider says that it is safe.  Return to your normal activities as told by your health care provider. Ask your health care provider what activities are safe for you.  Avoid sitting for a long time without moving. Get up to take short walks every 1-2 hours. This is important to improve blood flow and breathing. Ask for help if you feel weak or unsteady.  Avoid strenuous activity, contact sports, and abdominal exercises for 4 weeks or as long as told by your health care provider.   Incision care  Follow instructions from your health care provider about how to take care of your incision. Make sure you: ? Wash your hands with soap and water before and after you change your bandage (dressing). If soap and water are not available, use hand sanitizer. ? Change your dressing as told by your health care provider. ? Leave stitches (sutures), skin glue, or adhesive strips in place. These skin closures may  need to stay in place for 2 weeks or longer. If adhesive strip edges start to loosen and curl up, you may trim the loose edges. Do not remove adhesive strips completely unless your health care provider tells you to do that.  Keep the incision area clean and dry.  Check your incision area every day for signs of infection. Check for: ? More redness, swelling, or pain. ? More fluid or blood. ? Warmth. ? Pus or a bad smell.   Bathing  Do not take baths, swim, or use a hot tub until your health care provider approves. Ask your health care provider if you may take showers. You may only be allowed to take sponge baths.  If your health care provider approves bathing and showering, cover the dressing with a watertight covering to protect it from water. Do not let the dressing get wet.  Keep the dressing dry until your health care provider says it can be removed. Driving  Do not drive for 24 hours if you were given a sedative during your procedure. Follow other driving restrictions as told by your health care provider.  Do not drive or use heavy machinery while taking prescription pain medicine. Eating and drinking  Follow instructions from your health care provider about eating or drinking restrictions. This may include: ? What to eat and drink. You may be told to start eating a bland diet. Over time, you may slowly resume a more normal, healthy diet. ? How much to eat and drink. You should eat  small meals often and stop eating when you feel full.  Take nutrition supplements as told by your health care provider or dietitian. General instructions  Take over-the-counter and prescription medicines only as told by your health care provider.  Take steps to treat diarrhea or constipation as told by your health care provider. Your health care provider may recommend that you: ? Drink enough fluid to keep your urine pale yellow. Avoid fluids that contain a lot of sugar or caffeine, such as energy  drinks, sports drinks, and soda. ? Eat bland, easy-to-digest foods in small amounts as you are able. These foods include bananas, applesauce, rice, lean meats, toast, and crackers. ? Take over-the-counter or prescription medicines. ? Limit foods that are high in fat and processed sugars, such as fried or sweet foods.  Do not use any products that contain nicotine or tobacco, such as cigarettes, e-cigarettes, and chewing tobacco. These can delay incision healing after surgery. If you need help quitting, ask your health care provider.  Keep all follow-up visits as told by your health care provider. This is important. Contact a health care provider if:  You have more redness, swelling, or pain at the site of your incision.  You have more fluid or blood coming from your incision.  Your incision feels warm to the touch.  You have pus or a bad smell coming from your incision.  You have a fever.  Your incision breaks open.  You feel nauseous.  You are not able to have a bowel movement (are constipated).  Your diarrhea gets worse.  You have pain that is not controlled with medicine. Get help right away if you have:  Abdominal pain that does not go away or becomes severe.  Frequent vomiting and you are not able to eat or drink.  Difficulty breathing. Summary  After colostomy reversal surgery, it is common to have abdominal pain, decreased appetite, diarrhea, or constipation.  Follow instructions from your health care provider about how to take care of your incision. Do not let the dressing get wet.  Take over-the-counter and prescription medicines only as told by your health care provider.  Contact your health care provider if you are not able to have a bowel movement (are constipated).  Keep all follow-up visits as told by your health care provider. This is important. This information is not intended to replace advice given to you by your health care provider. Make sure you  discuss any questions you have with your health care provider. Document Revised: 10/08/2017 Document Reviewed: 10/08/2017 Elsevier Patient Education  2021 [employer].     COLON BOWEL PREP Please follow the instructions carefully. It is important to clean out your bowels & take the prescribed antibiotic pills to lower your chances of a wound infection or abscess.   FIVE DAYS PRIOR TO YOUR SURGERY Stop eating any nuts, popcorn, or fruit with seeds. Stop all fiber supplements such as Metamucil, Citrucel, etc.   Hold taking any blood thinning anticoagulation medication (ex: aspirin, warfarin/Coumadin, Plavix, Xarelto, Eliquis, Pradaxa, etc) as recommended by your medical/cardiology doctor  Obtain what you need at a pharmacy of your choice: -Filled out prescriptions for your oral antibiotics (Neomycin & Metronidazole)  -A bottle of MiraLax / Glycolax (288g) - no prescription required  -A large bottle of Gatorade / Powerade (64oz)  -Dulcolax tablets (4 tabs) - no prescription required   DAY PRIOR TO SURGERY   7:00am Swallow 4 Dulcolax tablets with some water Drink plenty of clear liquids  all day to avoid getting dehydrated (Water, juice, soda, coffee, tea, bouillon, jello, etc.)  10:00am Mix the bottle of MiraLax with the 64-oz bottle of Gatorade.  Drink the Gatorade mixture gradually over the next few hours (8oz glass every 15-30 minutes) until gone. You should finish by 2pm.  2:00pm Take 2 Neomycin 500mg  tablets & 2 Metronidazole 500mg  tablets  3:00pm Take 2 Neomycin 500mg  tablets & 2 Metronidazole 500mg  tablets  Drink plenty of clear liquids all evening to avoid getting dehydrated  10:00pm Take 2 Neomycin 500mg  tablets & 2 Metronidazole 500mg  tablets  Do not eat or drink anything after bedtime (midnight) the night before your surgery.   MORNING OF SURGERY Remember to not to drink or eat anything that morning  Hold or take medications as recommended by the hospital staff at  your Preoperative visit  If you have questions or concerns, please call [facility] [phone] during business hours to speak to the clinical staff for advice.

## 2020-08-23 NOTE — Progress Notes (Signed)
08/23/2020  History of Present Illness: Louis Singh is a 45 y.o. male presenting for follow s/p Hartmann's procedure in 02/2020.  He saw Dr. Maximino Greenland with GI in 07/2020 and repeat CT scan was obtained, which did not show any issues intra-abdominally, but showed a persistent right pleural effusion.  He underwent colonoscopy on 08/18/20 without any significant findings, and he recently saw Dr. Karna Christmas with Pulmonary medicine.  He had a right thoracentesis on 08/21/20 for 750 ml.  Cultures pending currently.    Today, patient reports that he feels much better after the thoracentesis.  His main symptom was discomfort in the right chest when he would raise his arm.  No he only has some soreness at the thoracentesis site itself.  Denies any abdominal discomfort or issues with the ostomy function.  Denies any fevers, chills, chest pain, shortness of breath, nausea, or vomiting.  Past Medical History: Past Medical History:  Diagnosis Date  . Diverticulosis   . Tobacco abuse      Past Surgical History: Past Surgical History:  Procedure Laterality Date  . COLECTOMY WITH COLOSTOMY CREATION/HARTMANN PROCEDURE N/A 02/17/2020   Procedure: COLECTOMY WITH COLOSTOMY CREATION/HARTMANN PROCEDURE;  Surgeon: Henrene Dodge, MD;  Location: [facility] ORS;  Service: General;  Laterality: N/A;  . COLONOSCOPY WITH PROPOFOL N/A 08/18/2020   Procedure: COLONOSCOPY WITH PROPOFOL;  Surgeon: Pasty Spillers, MD;  Location: [facility] ENDOSCOPY;  Service: Endoscopy;  Laterality: N/A;    Home Medications: Prior to Admission medications   Medication Sig Start Date End Date Taking? Authorizing Provider  metroNIDAZOLE (FLAGYL) 500 MG tablet Take 2 tabs (1000 mg) by mouth 3 times a day 08/23/20  Yes Kwabena Strutz, MD  neomycin (MYCIFRADIN) 500 MG tablet Take 2 tablets (1,000 mg total) by mouth once for 1 dose. 08/23/20 08/23/20 Yes Kayelynn Abdou, Elita Quick, MD  polyethylene glycol powder (MIRALAX) 17 GM/SCOOP powder Mix with 64 ounces of  Gatorade (no red) the day prior to surgery and drink as directed. 08/23/20  Yes Henrene Dodge, MD    Allergies: Allergies  Allergen Reactions  . Ciprofloxacin Other (See Comments)    Intolerance - increased anxiety   . Metronidazole Other (See Comments)    Intolerance- increases anxiety  . Shellfish Allergy Nausea And Vomiting and Other (See Comments)    Review of Systems: Review of Systems  Constitutional: Negative for chills and fever.  HENT: Negative for hearing loss.   Respiratory: Negative for shortness of breath.   Cardiovascular: Negative for chest pain.  Gastrointestinal: Negative for abdominal pain, blood in stool, nausea and vomiting.  Genitourinary: Negative for dysuria.  Musculoskeletal: Negative for myalgias.  Skin: Negative for rash.  Neurological: Negative for dizziness.  Psychiatric/Behavioral: Negative for depression.    Physical Exam BP 116/74   Pulse 73   Temp 97.9 F (36.6 C) (Oral)   Ht 5\' 10"  (1.778 m)   Wt 168 lb 6.4 oz (76.4 kg)   SpO2 96%   BMI 24.16 kg/m  CONSTITUTIONAL: No acute distress, normal body habitus HEENT:  Normocephalic, atraumatic, extraocular motion intact. RESPIRATORY:  Lungs are clear, and breath sounds are equal bilaterally. Normal respiratory effort without pathologic use of accessory muscles. CARDIOVASCULAR: Heart is regular without murmurs, gallops, or rubs. GI: The abdomen is soft, non-distended, non-tender to palpation.  LLQ ostomy healthy, with gas/stool in bag. Midline incision well healed, with larger scar inferiorly consistent with prior open wound/infection.  No hernias. MUSCULOSKELETAL:  No peripheral edema or cyanosis.  Normal gait. SKIN:  No gross skin lesions. NEUROLOGIC:  Motor and sensation is grossly normal.  Cranial nerves are grossly intact. PSYCH:  Alert and oriented to person, place and time. Affect is normal.  Labs/Imaging: CT scan abdomen/pelvis 08/07/20: IMPRESSION: 1. No acute abnormality in the  abdomen/pelvis. 2. Descending colostomy without peristomal hernia. 3. Increased size of right pleural effusion, at least moderate.  Previous left pleural effusion has resolved.  U/S Right thoracentesis 08/21/20: IMPRESSION: Successful ultrasound guided right thoracentesis yielding 750 cc of pleural fluid.  Colonoscopy 08/18/20: IMPRESSION: - Diverticulosis in the descending colon. - Erythematous mucosa in the Hartmann pouch. - No specimens collected. 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 Mucus balls were also present in the pouch.   Assessment and Plan: This is a 45 y.o. male with history of perforated diverticulitis, s/p Hartmann's procedure on 02/17/20.  --Patient had colonoscopy on 4/15 which did not show any suspicious findings and he can proceed with colostomy reversal.  He had persistent to increased right pleural effusion on recent CT scan, and he underwent right thoracentesis on 4/18 for 750 ml of fluid drained.  He feels much better today and feels ready for surgery. --Discussed with the patient the role for minimally invasive, robotic colostomy takedown.  Reviewed with him the risks of bleeding, infection, injury to surrounding structures, the possibility of converting to open surgery, the possibility of needing a diverting loop ileostomy, hospital course, post-op recovery, and he's willing to proceed. --Given his pleural effusion, he is scheduled for echocardiogram as ordered by Dr. Karna Christmas, which is being done today.  Will send surgical clearance form to Dr. Karna Christmas as well. --Tentatively will schedule him for surgery on 09/12/20.  Discussed with him that we would ask Urology to assist with cystoscopy and ICG injection into his ureters for better identification intraoperatively.  All questions have been answered.  Face-to-face time spent with the patient and care providers was 40 minutes, with more than 50% of the time spent  counseling, educating, and coordinating care of the patient.     [name], MD [facility]

## 2020-08-24 ENCOUNTER — Telehealth: Payer: Self-pay | Admitting: Surgery

## 2020-08-24 NOTE — Telephone Encounter (Signed)
Patient has been advised of Pre-Admission date/time, COVID Testing date and Surgery date.  Surgery Date: 09/12/20 Preadmission Testing Date: 09/01/20 (phone 8a-1p) Covid Testing Date: 5/6/ 22 in person at 10:00 am.  Patient  advised to [facility] to [facility] ([address] [facility]   Patient also reminded of colon prep and verbalized understanding.    Patient has been made aware to call [phone], between 1-3:00pm the day before surgery, to find out what time to arrive for surgery.

## 2020-08-25 ENCOUNTER — Other Ambulatory Visit: Payer: Self-pay | Admitting: Pulmonary Disease

## 2020-08-25 ENCOUNTER — Other Ambulatory Visit: Payer: Self-pay

## 2020-08-25 ENCOUNTER — Ambulatory Visit
Admission: RE | Admit: 2020-08-25 | Discharge: 2020-08-25 | Disposition: A | Discharge status: Home / Self Care | Payer: Managed Care, Other (non HMO) | Source: Ambulatory Visit | Attending: Pulmonary Disease | Admitting: Pulmonary Disease

## 2020-08-25 DIAGNOSIS — R0602 Shortness of breath: Secondary | ICD-10-CM

## 2020-08-25 LAB — BODY FLUID CULTURE W GRAM STAIN: Culture: NO GROWTH

## 2020-08-25 MED ORDER — IOHEXOL 350 MG/ML SOLN
75.0000 mL | Freq: Once | INTRAVENOUS | Status: AC | PRN
  Administered 2020-08-25: 75 mL via INTRAVENOUS

## 2020-08-25 NOTE — Progress Notes (Unsigned)
Pulmonary Clearance has been received from Dr Terence Lux office. The patient has been cleared at Low risk for surgery. They state "he had thoracentesis for removal of pleural fluid, now better".

## 2020-09-01 ENCOUNTER — Other Ambulatory Visit
Admission: RE | Admit: 2020-09-01 | Discharge: 2020-09-01 | Disposition: A | Discharge status: Home / Self Care | Payer: Managed Care, Other (non HMO) | Source: Ambulatory Visit | Attending: Surgery | Admitting: Surgery

## 2020-09-01 ENCOUNTER — Other Ambulatory Visit: Payer: Self-pay

## 2020-09-01 HISTORY — DX: Gastro-esophageal reflux disease without esophagitis: K21.9

## 2020-09-01 NOTE — Patient Instructions (Addendum)
Your procedure is scheduled on: Tuesday Sep 12, 2020. Report to Day [facility] inside [facility] 2nd floor (stop by admissions desk first before getting on elevator). To find out your arrival time please call [phone] between 1PM - 3PM on Monday Sep 11, 2020.  Remember: Instructions that are not followed completely may result in serious medical risk,  up to and including death, or upon the discretion of your surgeon and anesthesiologist your  surgery may need to be rescheduled.     _X__ 1. Do not eat food after midnight the night before your procedure.                 No chewing gum or hard candies. You may drink clear liquids up to 2 hours                 before you are scheduled to arrive for your surgery- DO not drink clear                 liquids within 2 hours of the start of your surgery.                 Clear Liquids include:  water, apple juice without pulp, clear Gatorade, G2 or                  Gatorade Zero (avoid Red/Purple/Blue), Black Coffee or Tea (Do not add                 anything to coffee or tea).  __X__2.  On the morning of surgery brush your teeth with toothpaste and water, you                may rinse your mouth with mouthwash if you wish.  Do not swallow any toothpaste of mouthwash.     _X__ 3.  No Alcohol for 24 hours before or after surgery.   _X__ 4.  Do Not Smoke or use e-cigarettes For 24 Hours Prior to Your Surgery.                 Do not use any chewable tobacco products for at least 6 hours prior to                 Surgery.  _X__  5.  Do not use any recreational drugs (marijuana, cocaine, heroin, ecstasy, MDMA or other)                For at least one week prior to your surgery.  Combination of these drugs with anesthesia                May have life threatening results.  __X__ 6.  Notify your doctor if there is any change in your medical condition      (cold, fever, infections).     Do not wear jewelry, make-up, hairpins,  clips or nail polish. Do not wear lotions, powders, or perfumes. You may wear deodorant. Do not shave 48 hours prior to surgery. Men may shave face and neck. Do not bring valuables to the hospital.    [facility] is not responsible for any belongings or valuables.  Contacts, dentures or bridgework may not be worn into surgery. Leave your suitcase in the car. After surgery it may be brought to your room. For patients admitted to the hospital, discharge time is determined by your treatment team.   Patients discharged the day of surgery will not be allowed to drive  home.   Make arrangements for someone to be with you for the first 24 hours of your Same Day Discharge.    __X__ Take these medicines the morning of surgery with A SIP OF WATER:    1. None   2.   3.   4.  5.  6.  __X__ Fleet Enema (as directed) use 1 the night before 10 pm and the other the morning of your procedure 6 am   __X__ Use CHG Soap (or wipes) as directed  ____ Use Benzoyl Peroxide Gel as instructed  ____ Use inhalers on the day of surgery  ____ Stop metformin 2 days prior to surgery    ____ Take 1/2 of usual insulin dose the night before surgery. No insulin the morning          of surgery.   ____ Call your PCP, cardiologist, or Pulmonologist if taking Coumadin/Plavix/aspirin and ask when to stop before your surgery.   __X__ One Week prior to surgery- Stop Anti-inflammatories such as Ibuprofen, Aleve, Advil, Motrin, meloxicam (MOBIC), diclofenac, etodolac, ketorolac, Toradol, Daypro, piroxicam, Goody's or BC powders. OK TO USE TYLENOL IF NEEDED   __X__ Stop supplements until after surgery.    ____ Bring C-Pap to the hospital.    If you have any questions regarding your pre-procedure instructions,  Please call Pre-admit Testing at [phone].

## 2020-09-04 ENCOUNTER — Ambulatory Visit: Payer: Self-pay | Admitting: Surgery

## 2020-09-04 ENCOUNTER — Institutional Professional Consult (permissible substitution): Payer: Managed Care, Other (non HMO) | Admitting: Pulmonary Disease

## 2020-09-06 ENCOUNTER — Telehealth: Payer: Self-pay

## 2020-09-06 NOTE — Telephone Encounter (Signed)
Completed Treatment and Self management plan request form-cina -faxed to Raye Sorrow @ [phone].rEturn to work date June 27th. Surgery scheduled 09/12/20.

## 2020-09-07 ENCOUNTER — Telehealth: Payer: Self-pay | Admitting: *Deleted

## 2020-09-07 NOTE — Telephone Encounter (Signed)
Spoke with patient to let him know he can mention this to Dr.Piscoya the morning of his surgery.

## 2020-09-07 NOTE — Telephone Encounter (Signed)
Patient called and stated that he is having surgery on 09/12/20 Dr Aleen Campi Colostomy takedown and when patient was here last Dr Aleen Campi talked with the patient that during surgery he could clean up the scar on his abdomen. Patient decieded that he does want Dr Aleen Campi to do that.

## 2020-09-08 ENCOUNTER — Telehealth: Payer: Self-pay | Admitting: Surgery

## 2020-09-08 ENCOUNTER — Other Ambulatory Visit: Payer: Self-pay

## 2020-09-08 ENCOUNTER — Other Ambulatory Visit
Admission: RE | Admit: 2020-09-08 | Discharge: 2020-09-08 | Disposition: A | Discharge status: Home / Self Care | Payer: Managed Care, Other (non HMO) | Source: Ambulatory Visit | Attending: Surgery | Admitting: Surgery

## 2020-09-08 DIAGNOSIS — Z20822 Contact with and (suspected) exposure to covid-19: Secondary | ICD-10-CM | POA: Insufficient documentation

## 2020-09-08 DIAGNOSIS — Z01812 Encounter for preprocedural laboratory examination: Secondary | ICD-10-CM | POA: Insufficient documentation

## 2020-09-08 LAB — SARS CORONAVIRUS 2 (TAT 6-24 HRS): SARS Coronavirus 2: NEGATIVE

## 2020-09-08 NOTE — Telephone Encounter (Signed)
Called patient today and discussed that we can add the scar revision to his consent on the day of surgery.  He also had questions about the enemas given on his preop appointment, and instructed that these are per rectum to clear the rectum for our instruments during surgery.  He understands and all questions are answered.  Henrene Dodge, MD

## 2020-09-08 NOTE — Telephone Encounter (Signed)
Patient is calling and has some questions about his upcoming surgery with Dr. Aleen Campi. Please call patient and advise.

## 2020-09-11 ENCOUNTER — Telehealth: Payer: Self-pay | Admitting: *Deleted

## 2020-09-11 MED ORDER — ALVIMOPAN 12 MG PO CAPS: 12.0000 mg | ORAL_CAPSULE | ORAL | Status: AC

## 2020-09-11 MED ORDER — FAMOTIDINE 20 MG PO TABS: 20.0000 mg | ORAL_TABLET | Freq: Once | ORAL | Status: AC

## 2020-09-11 MED ORDER — GABAPENTIN 300 MG PO CAPS
300.0000 mg | ORAL_CAPSULE | ORAL | Status: AC
Start: 2020-09-12 — End: 2020-09-12

## 2020-09-11 MED ORDER — BUPIVACAINE LIPOSOME 1.3 % IJ SUSP: 20.0000 mL | Freq: Once | INTRAMUSCULAR | Status: DC

## 2020-09-11 MED ORDER — FLEET ENEMA 7-19 GM/118ML RE ENEM: 1.0000 | ENEMA | Freq: Once | RECTAL | Status: DC

## 2020-09-11 MED ORDER — SODIUM CHLORIDE 0.9 % IV SOLN
2.0000 g | INTRAVENOUS | Status: AC
  Administered 2020-09-12 (×2): 2 g via INTRAVENOUS

## 2020-09-11 MED ORDER — CHLORHEXIDINE GLUCONATE CLOTH 2 % EX PADS: 6.0000 | MEDICATED_PAD | Freq: Once | CUTANEOUS | Status: DC

## 2020-09-11 MED ORDER — ORAL CARE MOUTH RINSE: 15.0000 mL | Freq: Once | OROMUCOSAL | Status: AC

## 2020-09-11 MED ORDER — CHLORHEXIDINE GLUCONATE 0.12 % MT SOLN: 15.0000 mL | Freq: Once | OROMUCOSAL | Status: AC

## 2020-09-11 MED ORDER — CHLORHEXIDINE GLUCONATE CLOTH 2 % EX PADS
6.0000 | MEDICATED_PAD | Freq: Once | CUTANEOUS | Status: AC
  Administered 2020-09-12: 6 via TOPICAL

## 2020-09-11 MED ORDER — ACETAMINOPHEN 500 MG PO TABS: 1000.0000 mg | ORAL_TABLET | ORAL | Status: AC

## 2020-09-11 MED ORDER — LACTATED RINGERS IV SOLN: INTRAVENOUS | Status: DC

## 2020-09-11 NOTE — Telephone Encounter (Signed)
Faxed to [employer] at [phone] 

## 2020-09-12 ENCOUNTER — Inpatient Hospital Stay
Admission: RE | Admit: 2020-09-12 | Discharge: 2020-09-15 | DRG: 331 | Disposition: A | Discharge status: Home / Self Care | Payer: Managed Care, Other (non HMO) | Attending: Surgery | Admitting: Surgery

## 2020-09-12 ENCOUNTER — Encounter
Admission: RE | Disposition: A | Discharge status: Home / Self Care | Payer: Self-pay | Source: Home / Self Care | Attending: Surgery

## 2020-09-12 ENCOUNTER — Inpatient Hospital Stay: Payer: Managed Care, Other (non HMO) | Admitting: Certified Registered Nurse Anesthetist

## 2020-09-12 ENCOUNTER — Encounter: Payer: Self-pay | Admitting: Surgery

## 2020-09-12 DIAGNOSIS — Z91013 Allergy to seafood: Secondary | ICD-10-CM

## 2020-09-12 DIAGNOSIS — N35912 Unspecified bulbous urethral stricture, male: Secondary | ICD-10-CM | POA: Diagnosis present

## 2020-09-12 DIAGNOSIS — Z888 Allergy status to other drugs, medicaments and biological substances status: Secondary | ICD-10-CM

## 2020-09-12 DIAGNOSIS — D72829 Elevated white blood cell count, unspecified: Secondary | ICD-10-CM | POA: Diagnosis present

## 2020-09-12 DIAGNOSIS — K9181 Other intraoperative complications of digestive system: Secondary | ICD-10-CM

## 2020-09-12 DIAGNOSIS — Z433 Encounter for attention to colostomy: Principal | ICD-10-CM

## 2020-09-12 DIAGNOSIS — Z881 Allergy status to other antibiotic agents status: Secondary | ICD-10-CM

## 2020-09-12 DIAGNOSIS — Z408 Encounter for other prophylactic surgery: Secondary | ICD-10-CM

## 2020-09-12 DIAGNOSIS — K9171 Accidental puncture and laceration of a digestive system organ or structure during a digestive system procedure: Secondary | ICD-10-CM

## 2020-09-12 DIAGNOSIS — Z933 Colostomy status: Secondary | ICD-10-CM

## 2020-09-12 DIAGNOSIS — K5780 Diverticulitis of intestine, part unspecified, with perforation and abscess without bleeding: Secondary | ICD-10-CM

## 2020-09-12 HISTORY — PX: CYSTOSCOPY: SHX5120

## 2020-09-12 HISTORY — PX: XI ROBOTIC ASSISTED COLOSTOMY TAKEDOWN: SHX6828

## 2020-09-12 SURGERY — CLOSURE, COLOSTOMY, ROBOT-ASSISTED
Anesthesia: General

## 2020-09-12 MED ORDER — SUGAMMADEX SODIUM 200 MG/2ML IV SOLN
INTRAVENOUS | Status: DC | PRN
  Administered 2020-09-12: 200 mg via INTRAVENOUS

## 2020-09-12 MED ORDER — LIDOCAINE HCL (PF) 2 % IJ SOLN
INTRAMUSCULAR | Status: AC
  Filled 2020-09-12: qty 5

## 2020-09-12 MED ORDER — CHLORHEXIDINE GLUCONATE 0.12 % MT SOLN
OROMUCOSAL | Status: AC
  Administered 2020-09-12: 15 mL via OROMUCOSAL
  Filled 2020-09-12: qty 15

## 2020-09-12 MED ORDER — ONDANSETRON HCL 4 MG/2ML IJ SOLN
INTRAMUSCULAR | Status: DC | PRN
  Administered 2020-09-12: 4 mg via INTRAVENOUS

## 2020-09-12 MED ORDER — POLYETHYLENE GLYCOL 3350 17 G PO PACK: 17.0000 g | PACK | Freq: Every day | ORAL | Status: DC | PRN

## 2020-09-12 MED ORDER — SEVOFLURANE IN SOLN
RESPIRATORY_TRACT | Status: AC
  Filled 2020-09-12: qty 250

## 2020-09-12 MED ORDER — ACETAMINOPHEN 500 MG PO TABS
ORAL_TABLET | ORAL | Status: AC
  Administered 2020-09-12: 1000 mg via ORAL
  Filled 2020-09-12: qty 2

## 2020-09-12 MED ORDER — KETOROLAC TROMETHAMINE 30 MG/ML IJ SOLN
30.0000 mg | Freq: Four times a day (QID) | INTRAMUSCULAR | Status: DC
Start: 2020-09-12 — End: 2020-09-15
  Administered 2020-09-12 – 2020-09-15 (×11): 30 mg via INTRAVENOUS
  Filled 2020-09-12 (×11): qty 1

## 2020-09-12 MED ORDER — KETAMINE HCL 10 MG/ML IJ SOLN
INTRAMUSCULAR | Status: DC | PRN
  Administered 2020-09-12 (×2): 20 mg via INTRAVENOUS
  Administered 2020-09-12: 10 mg via INTRAVENOUS

## 2020-09-12 MED ORDER — INDOCYANINE GREEN 25 MG IV SOLR
INTRAVENOUS | Status: DC | PRN
  Administered 2020-09-12: 10 mg

## 2020-09-12 MED ORDER — BUPIVACAINE-EPINEPHRINE (PF) 0.5% -1:200000 IJ SOLN
INTRAMUSCULAR | Status: DC | PRN
  Administered 2020-09-12: 30 mL

## 2020-09-12 MED ORDER — ENOXAPARIN SODIUM 40 MG/0.4ML IJ SOSY
40.0000 mg | PREFILLED_SYRINGE | INTRAMUSCULAR | Status: DC
  Administered 2020-09-13 – 2020-09-15 (×3): 40 mg via SUBCUTANEOUS
  Filled 2020-09-12 (×3): qty 0.4

## 2020-09-12 MED ORDER — LIDOCAINE HCL (CARDIAC) PF 100 MG/5ML IV SOSY
PREFILLED_SYRINGE | INTRAVENOUS | Status: DC | PRN
  Administered 2020-09-12: 100 mg via INTRAVENOUS

## 2020-09-12 MED ORDER — BUPIVACAINE LIPOSOME 1.3 % IJ SUSP
INTRAMUSCULAR | Status: DC | PRN
  Administered 2020-09-12: 20 mL

## 2020-09-12 MED ORDER — FAMOTIDINE 20 MG PO TABS
ORAL_TABLET | ORAL | Status: AC
  Administered 2020-09-12: 20 mg via ORAL
  Filled 2020-09-12: qty 1

## 2020-09-12 MED ORDER — SODIUM CHLORIDE 0.9 % IV SOLN
2.0000 g | Freq: Three times a day (TID) | INTRAVENOUS | Status: AC
  Administered 2020-09-12 – 2020-09-13 (×3): 2 g via INTRAVENOUS
  Filled 2020-09-12 (×3): qty 2

## 2020-09-12 MED ORDER — HYDROMORPHONE HCL 1 MG/ML IJ SOLN: 0.5000 mg | INTRAMUSCULAR | Status: DC | PRN

## 2020-09-12 MED ORDER — LACTATED RINGERS IV SOLN: INTRAVENOUS | Status: DC

## 2020-09-12 MED ORDER — SODIUM CHLORIDE 0.9 % IV SOLN
INTRAVENOUS | Status: AC
  Filled 2020-09-12: qty 2

## 2020-09-12 MED ORDER — DEXAMETHASONE SODIUM PHOSPHATE 10 MG/ML IJ SOLN
INTRAMUSCULAR | Status: DC | PRN
  Administered 2020-09-12: 8 mg via INTRAVENOUS

## 2020-09-12 MED ORDER — PROPOFOL 10 MG/ML IV BOLUS
INTRAVENOUS | Status: AC
  Filled 2020-09-12: qty 20

## 2020-09-12 MED ORDER — DEXMEDETOMIDINE (PRECEDEX) IN NS 20 MCG/5ML (4 MCG/ML) IV SYRINGE
PREFILLED_SYRINGE | INTRAVENOUS | Status: DC | PRN
  Administered 2020-09-12 (×3): 4 ug via INTRAVENOUS

## 2020-09-12 MED ORDER — FENTANYL CITRATE (PF) 100 MCG/2ML IJ SOLN
INTRAMUSCULAR | Status: AC
  Filled 2020-09-12: qty 2

## 2020-09-12 MED ORDER — BUPIVACAINE LIPOSOME 1.3 % IJ SUSP
INTRAMUSCULAR | Status: AC
  Filled 2020-09-12: qty 20

## 2020-09-12 MED ORDER — NAPHAZOLINE-GLYCERIN 0.012-0.25 % OP SOLN
1.0000 [drp] | Freq: Four times a day (QID) | OPHTHALMIC | Status: DC | PRN
  Filled 2020-09-12: qty 15

## 2020-09-12 MED ORDER — GABAPENTIN 300 MG PO CAPS
ORAL_CAPSULE | ORAL | Status: AC
  Administered 2020-09-12: 300 mg via ORAL
  Filled 2020-09-12: qty 1

## 2020-09-12 MED ORDER — PANTOPRAZOLE SODIUM 40 MG IV SOLR
40.0000 mg | Freq: Every day | INTRAVENOUS | Status: DC
  Administered 2020-09-12 – 2020-09-14 (×3): 40 mg via INTRAVENOUS
  Filled 2020-09-12 (×3): qty 40

## 2020-09-12 MED ORDER — CHLORHEXIDINE GLUCONATE CLOTH 2 % EX PADS
6.0000 | MEDICATED_PAD | Freq: Every day | CUTANEOUS | Status: DC
  Administered 2020-09-13 – 2020-09-15 (×3): 6 via TOPICAL

## 2020-09-12 MED ORDER — PROMETHAZINE HCL 25 MG/ML IJ SOLN
INTRAMUSCULAR | Status: AC
  Filled 2020-09-12: qty 1

## 2020-09-12 MED ORDER — ONDANSETRON HCL 4 MG/2ML IJ SOLN
4.0000 mg | Freq: Four times a day (QID) | INTRAMUSCULAR | Status: DC | PRN
  Administered 2020-09-13: 4 mg via INTRAVENOUS
  Filled 2020-09-12: qty 2

## 2020-09-12 MED ORDER — PROPOFOL 10 MG/ML IV BOLUS
INTRAVENOUS | Status: DC | PRN
  Administered 2020-09-12: 170 mg via INTRAVENOUS

## 2020-09-12 MED ORDER — ACETAMINOPHEN 10 MG/ML IV SOLN
INTRAVENOUS | Status: AC
  Filled 2020-09-12: qty 100

## 2020-09-12 MED ORDER — MEPERIDINE HCL 50 MG/ML IJ SOLN: 6.2500 mg | INTRAMUSCULAR | Status: DC | PRN

## 2020-09-12 MED ORDER — ALVIMOPAN 12 MG PO CAPS
ORAL_CAPSULE | ORAL | Status: AC
  Administered 2020-09-12: 12 mg via ORAL
  Filled 2020-09-12: qty 1

## 2020-09-12 MED ORDER — PHENYLEPHRINE HCL (PRESSORS) 10 MG/ML IV SOLN
INTRAVENOUS | Status: DC | PRN
  Administered 2020-09-12: 100 ug via INTRAVENOUS
  Administered 2020-09-12 (×2): 50 ug via INTRAVENOUS
  Administered 2020-09-12: 100 ug via INTRAVENOUS

## 2020-09-12 MED ORDER — ALVIMOPAN 12 MG PO CAPS
12.0000 mg | ORAL_CAPSULE | Freq: Two times a day (BID) | ORAL | Status: DC
  Administered 2020-09-13 (×2): 12 mg via ORAL
  Filled 2020-09-12 (×3): qty 1

## 2020-09-12 MED ORDER — ONDANSETRON HCL 4 MG/2ML IJ SOLN
4.0000 mg | Freq: Once | INTRAMUSCULAR | Status: AC | PRN
  Administered 2020-09-12: 4 mg via INTRAVENOUS

## 2020-09-12 MED ORDER — INDOCYANINE GREEN 25 MG IV SOLR
INTRAVENOUS | Status: DC | PRN
  Administered 2020-09-12: 2.5 mg via INTRAVENOUS

## 2020-09-12 MED ORDER — ACETAMINOPHEN 500 MG PO TABS
1000.0000 mg | ORAL_TABLET | Freq: Four times a day (QID) | ORAL | Status: DC
  Administered 2020-09-13 – 2020-09-15 (×7): 1000 mg via ORAL
  Filled 2020-09-12 (×8): qty 2

## 2020-09-12 MED ORDER — FENTANYL CITRATE (PF) 100 MCG/2ML IJ SOLN
INTRAMUSCULAR | Status: DC | PRN
  Administered 2020-09-12: 50 ug via INTRAVENOUS
  Administered 2020-09-12: 100 ug via INTRAVENOUS
  Administered 2020-09-12: 50 ug via INTRAVENOUS

## 2020-09-12 MED ORDER — SODIUM CHLORIDE (PF) 0.9 % IJ SOLN
INTRAMUSCULAR | Status: AC
  Filled 2020-09-12: qty 50

## 2020-09-12 MED ORDER — PROMETHAZINE HCL 25 MG/ML IJ SOLN
12.5000 mg | INTRAMUSCULAR | Status: DC | PRN
Start: 2020-09-12 — End: 2020-09-12
  Administered 2020-09-12: 12.5 mg via INTRAVENOUS

## 2020-09-12 MED ORDER — ROCURONIUM BROMIDE 100 MG/10ML IV SOLN
INTRAVENOUS | Status: DC | PRN
  Administered 2020-09-12: 20 mg via INTRAVENOUS
  Administered 2020-09-12: 10 mg via INTRAVENOUS
  Administered 2020-09-12 (×2): 20 mg via INTRAVENOUS
  Administered 2020-09-12: 50 mg via INTRAVENOUS
  Administered 2020-09-12: 30 mg via INTRAVENOUS
  Administered 2020-09-12 (×2): 20 mg via INTRAVENOUS
  Administered 2020-09-12: 10 mg via INTRAVENOUS
  Administered 2020-09-12: 20 mg via INTRAVENOUS
  Administered 2020-09-12: 10 mg via INTRAVENOUS

## 2020-09-12 MED ORDER — EPHEDRINE SULFATE 50 MG/ML IJ SOLN
INTRAMUSCULAR | Status: DC | PRN
  Administered 2020-09-12: 10 mg via INTRAVENOUS

## 2020-09-12 MED ORDER — ACETAMINOPHEN 10 MG/ML IV SOLN
INTRAVENOUS | Status: DC | PRN
  Administered 2020-09-12: 1000 mg via INTRAVENOUS

## 2020-09-12 MED ORDER — ONDANSETRON 4 MG PO TBDP: 4.0000 mg | ORAL_TABLET | Freq: Four times a day (QID) | ORAL | Status: DC | PRN

## 2020-09-12 MED ORDER — OXYCODONE HCL 5 MG PO TABS
5.0000 mg | ORAL_TABLET | ORAL | Status: DC | PRN
  Administered 2020-09-13: 5 mg via ORAL
  Filled 2020-09-12: qty 1

## 2020-09-12 MED ORDER — MIDAZOLAM HCL 2 MG/2ML IJ SOLN
INTRAMUSCULAR | Status: AC
  Filled 2020-09-12: qty 2

## 2020-09-12 MED ORDER — KETAMINE HCL 50 MG/5ML IJ SOSY
PREFILLED_SYRINGE | INTRAMUSCULAR | Status: AC
  Filled 2020-09-12: qty 5

## 2020-09-12 MED ORDER — FENTANYL CITRATE (PF) 100 MCG/2ML IJ SOLN: 25.0000 ug | INTRAMUSCULAR | Status: DC | PRN

## 2020-09-12 MED ORDER — ONDANSETRON HCL 4 MG/2ML IJ SOLN
INTRAMUSCULAR | Status: AC
  Filled 2020-09-12: qty 2

## 2020-09-12 MED ORDER — MIDAZOLAM HCL 2 MG/2ML IJ SOLN
INTRAMUSCULAR | Status: DC | PRN
  Administered 2020-09-12: 2 mg via INTRAVENOUS

## 2020-09-12 SURGICAL SUPPLY — 97 items
BAG DRAIN CYSTO-URO LG1000N (MISCELLANEOUS) IMPLANT
BULB RESERV EVAC DRAIN JP 100C (MISCELLANEOUS) ×2 IMPLANT
CANNULA REDUC XI 12-8 STAPL (CANNULA) ×1
CANNULA REDUCER 12-8 DVNC XI (CANNULA) ×1 IMPLANT
CATH FOLEY 2WAY  5CC 16FR (CATHETERS) ×1
CATH URETL 5X70 OPEN END (CATHETERS) ×2 IMPLANT
CATH URTH 16FR FL 2W BLN LF (CATHETERS) ×1 IMPLANT
CHLORAPREP W/TINT 26 (MISCELLANEOUS) ×2 IMPLANT
CLIP VESOLOCK LG 6/CT PURPLE (CLIP) IMPLANT
COVER MAYO STAND REUSABLE (DRAPES) IMPLANT
COVER WAND RF STERILE (DRAPES) ×2 IMPLANT
DECANTER SPIKE VIAL GLASS SM (MISCELLANEOUS) ×2 IMPLANT
DEFOGGER SCOPE WARMER CLEARIFY (MISCELLANEOUS) ×4 IMPLANT
DERMABOND ADVANCED (GAUZE/BANDAGES/DRESSINGS) ×1
DERMABOND ADVANCED .7 DNX12 (GAUZE/BANDAGES/DRESSINGS) ×1 IMPLANT
DRAIN CHANNEL JP 19F (MISCELLANEOUS) ×2 IMPLANT
DRAIN PENROSE 1/4X12 LTX STRL (WOUND CARE) ×2 IMPLANT
DRAPE ARM DVNC X/XI (DISPOSABLE) ×4 IMPLANT
DRAPE COLUMN DVNC XI (DISPOSABLE) ×1 IMPLANT
DRAPE DA VINCI XI ARM (DISPOSABLE) ×4
DRAPE DA VINCI XI COLUMN (DISPOSABLE) ×1
DRAPE LEGGINS SURG 28X43 STRL (DRAPES) ×2 IMPLANT
DRAPE UNDER BUTTOCK W/FLU (DRAPES) ×2 IMPLANT
DRSG OPSITE POSTOP 4X6 (GAUZE/BANDAGES/DRESSINGS) ×2 IMPLANT
DRSG TEGADERM 2-3/8X2-3/4 SM (GAUZE/BANDAGES/DRESSINGS) ×6 IMPLANT
DRSG TEGADERM 4X4.75 (GAUZE/BANDAGES/DRESSINGS) ×2 IMPLANT
ELECT BLADE 6.5 EXT (BLADE) ×2 IMPLANT
ELECT CAUTERY BLADE 6.4 (BLADE) ×2 IMPLANT
ELECT REM PT RETURN 9FT ADLT (ELECTROSURGICAL) ×2
ELECTRODE REM PT RTRN 9FT ADLT (ELECTROSURGICAL) ×1 IMPLANT
GAUZE SPONGE 4X4 12PLY STRL (GAUZE/BANDAGES/DRESSINGS) ×2 IMPLANT
GLOVE SURG SYN 6.5 ES PF (GLOVE) ×2 IMPLANT
GLOVE SURG SYN 7.0 (GLOVE) ×20 IMPLANT
GLOVE SURG SYN 7.5  E (GLOVE) ×10
GLOVE SURG SYN 7.5 E (GLOVE) ×10 IMPLANT
GOWN STRL REUS W/ TWL LRG LVL3 (GOWN DISPOSABLE) ×11 IMPLANT
GOWN STRL REUS W/TWL LRG LVL3 (GOWN DISPOSABLE) ×11
GRASPER LAPSCPC 5X45 DSP (INSTRUMENTS) IMPLANT
GRASPER SUT TROCAR 14GX15 (MISCELLANEOUS) ×2 IMPLANT
HANDLE YANKAUER SUCT BULB TIP (MISCELLANEOUS) ×2 IMPLANT
IRRIGATOR SUCT 8 DISP DVNC XI (IRRIGATION / IRRIGATOR) IMPLANT
IRRIGATOR SUCTION 8MM XI DISP (IRRIGATION / IRRIGATOR)
IV NS 1000ML (IV SOLUTION) ×1
IV NS 1000ML BAXH (IV SOLUTION) ×1 IMPLANT
IV NS IRRIG 3000ML ARTHROMATIC (IV SOLUTION) ×2 IMPLANT
KIT IMAGING PINPOINTPAQ (MISCELLANEOUS) ×4 IMPLANT
KIT PINK PAD W/HEAD ARE REST (MISCELLANEOUS) ×2
KIT PINK PAD W/HEAD ARM REST (MISCELLANEOUS) ×1 IMPLANT
LABEL OR SOLS (LABEL) ×2 IMPLANT
MANIFOLD NEPTUNE II (INSTRUMENTS) ×4 IMPLANT
NEEDLE HYPO 22GX1.5 SAFETY (NEEDLE) ×2 IMPLANT
NEEDLE INSUFFLATION 14GA 120MM (NEEDLE) ×2 IMPLANT
NS IRRIG 500ML POUR BTL (IV SOLUTION) ×2 IMPLANT
OBTURATOR OPTICAL STANDARD 8MM (TROCAR) ×1
OBTURATOR OPTICAL STND 8 DVNC (TROCAR) ×1
OBTURATOR OPTICALSTD 8 DVNC (TROCAR) ×1 IMPLANT
PACK COLON CLEAN CLOSURE (MISCELLANEOUS) ×2 IMPLANT
PACK CYSTO AR (MISCELLANEOUS) ×2 IMPLANT
PACK LAP CHOLECYSTECTOMY (MISCELLANEOUS) ×2 IMPLANT
PAD PREP 24X41 OB/GYN DISP (PERSONAL CARE ITEMS) IMPLANT
PENCIL ELECTRO HAND CTR (MISCELLANEOUS) ×2 IMPLANT
RETRACTOR WOUND ALXS 18CM SML (MISCELLANEOUS) ×1 IMPLANT
RTRCTR WOUND ALEXIS O 18CM SML (MISCELLANEOUS) ×2
SEAL CANN UNIV 5-8 DVNC XI (MISCELLANEOUS) ×3 IMPLANT
SEAL XI 5MM-8MM UNIVERSAL (MISCELLANEOUS) ×3
SEALER VESSEL DA VINCI XI (MISCELLANEOUS) ×1
SEALER VESSEL EXT DVNC XI (MISCELLANEOUS) ×1 IMPLANT
SET CYSTO W/LG BORE CLAMP LF (SET/KITS/TRAYS/PACK) ×2 IMPLANT
SOL PREP PVP 2OZ (MISCELLANEOUS) ×2
SOLUTION ELECTROLUBE (MISCELLANEOUS) ×2 IMPLANT
SOLUTION PREP PVP 2OZ (MISCELLANEOUS) ×1 IMPLANT
SPONGE LAP 18X18 RF (DISPOSABLE) ×4 IMPLANT
SPONGE LAP 4X18 RFD (DISPOSABLE) IMPLANT
STAPLER CANNULA SEAL DVNC XI (STAPLE) ×1 IMPLANT
STAPLER CANNULA SEAL XI (STAPLE) ×1
STAPLER CIRCULAR MANUAL XL 25 (STAPLE) IMPLANT
STAPLER CIRCULAR MANUAL XL 29 (STAPLE) IMPLANT
STAPLER CIRCULAR MANUAL XL 33 (STAPLE) IMPLANT
STAPLER SKIN PROX 35W (STAPLE) ×2 IMPLANT
SURGILUBE 2OZ TUBE FLIPTOP (MISCELLANEOUS) ×4 IMPLANT
SUT DVC VLOC 3-0 CL 6 P-12 (SUTURE) IMPLANT
SUT MNCRL AB 4-0 PS2 18 (SUTURE) ×2 IMPLANT
SUT PDS AB 1 CT1 36 (SUTURE) ×4 IMPLANT
SUT PROLENE 2 0 SH DA (SUTURE) ×2 IMPLANT
SUT V-LOC 90 ABS 3-0 VLT  V-20 (SUTURE) ×1
SUT V-LOC 90 ABS 3-0 VLT V-20 (SUTURE) ×1 IMPLANT
SUT VIC AB 2-0 SH 27 (SUTURE) ×1
SUT VIC AB 2-0 SH 27XBRD (SUTURE) ×1 IMPLANT
SUT VICRYL 0 AB UR-6 (SUTURE) ×4 IMPLANT
SUT VLOC 90 S/L VL9 GS22 (SUTURE) IMPLANT
SYR 10ML LL (SYRINGE) ×4 IMPLANT
SYR TOOMEY IRRIG 70ML (MISCELLANEOUS)
SYRINGE TOOMEY IRRIG 70ML (MISCELLANEOUS) IMPLANT
TRAY FOLEY MTR SLVR 16FR STAT (SET/KITS/TRAYS/PACK) ×2 IMPLANT
TRAY FOLEY SLVR 16FR LF STAT (SET/KITS/TRAYS/PACK) IMPLANT
TUBING EVAC SMOKE HEATED PNEUM (TUBING) ×2 IMPLANT
WATER STERILE IRR 1000ML POUR (IV SOLUTION) ×2 IMPLANT

## 2020-09-12 NOTE — Transfer of Care (Signed)
Immediate Anesthesia Transfer of Care Note  Patient: Louis Singh  Procedure(s) Performed: XI ROBOTIC ASSISTED COLOSTOMY TAKEDOWN with Lincoln Brigham PA-C to assist (N/A ) INDOCYANINE GREEN FLUORESCENCE IMAGING (ICG) (N/A ) CYSTOSCOPY (N/A )  Patient Location: PACU  Anesthesia Type:General  Level of Consciousness: drowsy  Airway & Oxygen Therapy: Patient Spontanous Breathing and Patient connected to face mask oxygen  Post-op Assessment: Report given to RN and Post -op Vital signs reviewed and stable  Post vital signs: Reviewed and stable  Last Vitals:  Vitals Value Taken Time  BP 115/73 09/12/20 1711  Temp    Pulse 73 09/12/20 1715  Resp 12 09/12/20 1715  SpO2 100 % 09/12/20 1715  Vitals shown include unvalidated device data.  Last Pain:  Vitals:   09/12/20 0619  TempSrc: Tympanic  PainSc: 0-No pain         Complications: No complications documented.

## 2020-09-12 NOTE — Interval H&P Note (Signed)
History and Physical Interval Note:  09/12/2020 7:52 AM  Louis Singh  has presented today for surgery, with the diagnosis of colostomy ion place.  The various methods of treatment have been discussed with the patient and family. After consideration of risks, benefits and other options for treatment, the patient has consented to  Procedure(s) with comments: XI ROBOTIC ASSISTED COLOSTOMY TAKEDOWN with Lincoln Brigham PA-C to assist (N/A) - Provider requesting 4 hours / 240 minutes for procedure. INDOCYANINE GREEN FLUORESCENCE IMAGING (ICG) (N/A) CYSTOSCOPY (N/A) as a surgical intervention.  The patient's history has been reviewed, patient examined, no change in status, stable for surgery.  I have reviewed the patient's chart and labs.  Questions were answered to the patient's satisfaction.       Vanna Scotland

## 2020-09-12 NOTE — Op Note (Signed)
Date of procedure: 09/12/20  Preoperative diagnosis:  1. History of diverticulitis  Postoperative diagnosis:  1. Same as above 2. Bulbar urethral stricture  Procedure: 1. Cystoscopy 2. Bilateral retrograde ureteral injection of ICG for the purpose of ureteral identification  Surgeon: Vanna Scotland, MD  Anesthesia: General  Complications: None  Intraoperative findings: Soft bulbar urethral stricture, approximately 12 French in diameter.  Cystoscopy otherwise unremarkable.  EBL: Minimal  Specimens: None  Drains: 16 French Foley catheter  Indication: Louis Singh is a 45 y.o. patient with history of diverticulitis undergoing colostomy takedown.  After reviewing the management options for treatment, he elected to proceed with the above surgical procedure(s). We have discussed the potential benefits and risks of the procedure, side effects of the proposed treatment, the likelihood of the patient achieving the goals of the procedure, and any potential problems that might occur during the procedure or recuperation. Informed consent has been obtained.  Description of procedure:  The patient was taken to the operating room and general anesthesia was induced.  The patient was placed in the dorsal lithotomy position, prepped and draped in the usual sterile fashion, and preoperative antibiotics were administered. A preoperative time-out was performed.   21 [place] scope was advanced per urethra to the bulbar urethra where a very mild urethral stricture was identified.  It was relatively short but narrow, 12 [place].  I advanced an open-ended ureteral catheter through the stricture as a guide and given that the stricture was very small in somewhat soft appearing, I went ahead and advanced my 21 French cystoscope through the stricture thereby dilating it.  I was unable to advance the scope into the bladder at this point.  The bladder was inspected and was unremarkable.  Attention was turned to the  left ureteral orifice.  It was cannulated using a 5 [place] open-ended ureteral catheter within the UO.  10 cc of ICG was injected in a retrograde fashion for the purpose of ureteral identification.  The same technique was used on the right.  Upon removing the scope, the bulbar urethral stricture was noted.  There was a small urethral abrasion appreciated at this location.  I placed a 69 French Foley catheter which went easily.  The balloon was inflated with 10 cc of sterile water.  The remainder of the procedure was completed by Dr. Aleen Campi.  Operative findings of a urethral stricture were discussed.  Recommend leaving Foley catheter for 48 hours postop.  Vanna Scotland, M.D.

## 2020-09-12 NOTE — Op Note (Signed)
Procedure Date:  09/12/2020  Pre-operative Diagnosis:  Colostomy in place  Post-operative Diagnosis:  Colostomy in place  Procedure:   1.  Robotic assisted Colostomy takedown 2.  Robotic assisted splenic flexure mobilization 3.  Robotic assisted partial colectomy with anastomosis 4.  Rigid Proctosigmoidoscopy  Surgeon:  [name], MD  Assistant:  Campbell Lerner, MD.  His assistance was critical due to the complexity of the case and for assistance with the complication noted below.    Anesthesia:  General endotracheal  Estimated Blood Loss:  200 ml  Specimens: End colostomy Proximal rectum  Complications:  There was a blunt traumatic perforation of the proximal colon while attempting to dilate the rectal stump with a 29 mm dilator.  Dr. Claudine Mouton assisted with the repair and final anastomosis.  Indications for Procedure:  This is a 45 y.o. male with a history of prior Hartmann's procedure in 2021 for perforated diverticulitis, and now presents for colostomy takedown and anastomosis.  Colonoscopy did not reveal any other issues that would preclude surgery. The risks of bleeding, infection, bowel injury, need for diverting ostomy, and need for further procedures were all discussed with the patient and he was willing to proceed.   Description of Procedure: The patient was correctly identified in the preoperative area and brought into the operating room.  The patient was placed supine with VTE prophylaxis in place.  Appropriate time-outs were performed.  Anesthesia was induced and the patient was intubated.  Appropriate antibiotics were infused.  The patient was then placed in lithotomy position.   Dr. Apolinar Junes then proceeded with a cystoscopy and injection of ICG into bilateral ureters for the surgery.  Please see Dr. Delana Meyer op note for further details and findings.   The end colostomy was sutured closed with a 2-0 Silk suture.  The abdomen was prepped and draped in a sterile  fashion. Pneumoperitoneum was obtained using a Veress needle in the right upper quadrant, which was followed by placement of an 8 mm robotic port using Optivue technique in the right lateral abdomen.  Then two additional 8 mm ports were placed in the right side and a 12 mm in the RLQ.  The DaVinci platform was docked, camera targeted, and instruments inserted under direct visualization.  The patient had significant adhesions of the small bowel between small bowel loops, small bowel and the anterior abdominal wall, pelvic wall, and side walls.  There were also adhesions from the omentum to the anterior abdominal wall.  We mobilized all the adhesions first with combination of blunt and cautery dissection.  There was some mild oozing in doing this.  We then proceeded to mobilize adhesions around the end colostomy itself.  Then proceeded to mobilize the proximal descending colon and splenic flexure by mobilizing along the White line of Toldt and off the splenocolic attachments.  There was good length obtained in doing this, and it appeared that the end colostomy would reach the pelvis without any tension.  Following that, we proceeded to free up the pelvis and small bowel was full mobilized off the pelvis and the two Prolene marking sutures for the rectal stump staple line were identified.  During all this dissection, FireFly was used to evaluate both ureters and they were always preserved intact.  The rectal stump was mobilized proximally.    The DaVinci platform was then undocked.  The end colostomy was then mobilized by dissecting around the mucocutaneous junction, through the subcutaneous tissue and down to the fascia.  The end  colostomy was fully dissected free and again we tested the length of our mobilization which was good.  The distal 1 cm of the colostomy was resected and the distal open end was dilated using 25, 29, and 33 mm dilators.  The 33 mm dilator fit well, and it was decided to use the 33 mm EEA  stapler.  The anvil was placed in the open end and a 2-0 Prolene suture was used to create a purse string suture around the anvil, securing all the edges circumferentially without any gaps.  Excess epiploic fat was dissected off the anvil.  A wound protector was used and the colon with anvil was inserted back into the abdomen.  The wound protector was clamped and the abdomen was insufflated again.   Dilators were then used at the rectal end and the during dilation with the 29 mm dilator, a traumatic blunt perforation was done in the proximal rectum.  The end of the dilator could be seen inside the abdominal cavity.  At that point, I called Dr. Claudine Mouton to come to the OR to assist.  The DaVinci platform was docked again, and he proceeded to fully mobilize the proximal rectum to the site of the perforation, and then transected the proximal portion at the level of the perforation, creating a larger overall opening.  At that point, the 33 mm EEA stapler was inserted transrectally and spike was deployed.  A 2-0 v-lock purse string suture was then run along the open end of the rectal stump around the spike of the stapler.  The rectal stump opening was then cinched down and the rectal stump was tied appropriately around the spike.  This was reinforced with another 2-0 v-lock in an area that was not fully cinched.  The anvil was then joined to the spike and the stapler was closed.  2.5 ml of IV ICG were then injected and FireFly was used to visualize good blood flow to both ends of the anastomosis.  The stapler was fired and then removed.  A rigid sigmoidoscope was then introduced and moved towards the new colorectal anastomosis.  The pelvis was filled with saline and a leak test was performed which revealed no air bubbles.  This was done twice to confirm a good anastomotic seal.  Following that, the saline was suctioned out, and the DaVinci platform was undocked.  The 12 mm RLQ port was removed and the fascial opening  was closed with PMI and 0 Vicryl suture.  All ports were removed and a 19 Fr. Blake drain was introduced into the pelvis through one of the left lateral incisions.  We then scrubbed out and back for our clean closure.  50 ml of Exparel with 0.5% bupivacaine and epi was infiltrated onto the peritoneum, fascia, subcutaneous tissue, and the port incisions.  The ostomy site fascia was closed with #1 PDS suture x 2.  The cavity was irrigated thoroughly, and the skin edges were approximated using 3-0 Vicryl, tacking the skin to the underlying fascia to eliminate any empty space.  The skin wounds were cleaned, and all incisions were closed using skin stapler.  The drain was secured using 3-0 Nylon suture.  All wounds were then dressed with 4x4 gauze and TegaDerm.  Drapes were all removed, and it appeared that the Foley catheter had been pulled.  A new 16 Fr. Foley catheter was placed and connected to gravity drainage.   The patient was emerged from anesthesia and extubated and brought to the recovery  room for further management.   The patient tolerated the procedure well and all counts were correct at the end of the case.    [name], MD

## 2020-09-12 NOTE — Interval H&P Note (Signed)
History and Physical Interval Note:  09/12/2020 7:05 AM  Louis Singh  has presented today for surgery, with the diagnosis of colostomy ion place.  The various methods of treatment have been discussed with the patient and family. After consideration of risks, benefits and other options for treatment, the patient has consented to  Procedure(s) with comments: XI ROBOTIC ASSISTED COLOSTOMY TAKEDOWN with Lincoln Brigham PA-C to assist (N/A) - Provider requesting 4 hours / 240 minutes for procedure. INDOCYANINE GREEN FLUORESCENCE IMAGING (ICG) (N/A) CYSTOSCOPY (N/A) as a surgical intervention.  The patient's history has been reviewed, patient examined, no change in status, stable for surgery.  I have reviewed the patient's chart and labs.  Questions were answered to the patient's satisfaction.     Ahmari Duerson
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
## 2020-09-12 NOTE — Anesthesia Postprocedure Evaluation (Signed)
Anesthesia Post Note  Patient: Louis Singh  Procedure(s) Performed: XI ROBOTIC ASSISTED COLOSTOMY TAKEDOWN with Lincoln Brigham PA-C to assist (N/A ) INDOCYANINE GREEN FLUORESCENCE IMAGING (ICG) (N/A ) CYSTOSCOPY (N/A )  Patient location during evaluation: PACU Anesthesia Type: General Level of consciousness: awake and alert Pain management: pain level controlled Vital Signs Assessment: post-procedure vital signs reviewed and stable Respiratory status: spontaneous breathing, nonlabored ventilation, respiratory function stable and patient connected to nasal cannula oxygen Cardiovascular status: blood pressure returned to baseline and stable Postop Assessment: no apparent nausea or vomiting Anesthetic complications: no   No complications documented.   Last Vitals:  Vitals:   09/12/20 1715 09/12/20 1721  BP: 115/80   Pulse: 73 71  Resp: 12 11  Temp:    SpO2: 100% 100%    Last Pain:  Vitals:   09/12/20 1711  TempSrc:   PainSc: Asleep                 Yevette Edwards

## 2020-09-12 NOTE — Anesthesia Procedure Notes (Signed)
Procedure Name: Intubation Date/Time: 09/12/2020 8:09 AM Performed by: Henrietta Hoover, CRNA Pre-anesthesia Checklist: Patient identified, Patient being monitored, Timeout performed, Emergency Drugs available and Suction available Patient Re-evaluated:Patient Re-evaluated prior to induction Oxygen Delivery Method: Circle system utilized Preoxygenation: Pre-oxygenation with 100% oxygen Induction Type: IV induction Ventilation: Mask ventilation without difficulty Laryngoscope Size: McGraph and 4 Grade View: Grade I Tube type: Oral Tube size: 7.5 mm Number of attempts: 1 Airway Equipment and Method: Stylet Placement Confirmation: ETT inserted through vocal cords under direct vision,  positive ETCO2 and breath sounds checked- equal and bilateral Secured at: 21 cm Tube secured with: Tape Dental Injury: Teeth and Oropharynx as per pre-operative assessment

## 2020-09-13 ENCOUNTER — Encounter: Payer: Self-pay | Admitting: Surgery

## 2020-09-13 ENCOUNTER — Other Ambulatory Visit: Payer: Self-pay

## 2020-09-13 LAB — CBC WITH DIFFERENTIAL/PLATELET
Abs Immature Granulocytes: 0.06 10*3/uL (ref 0.00–0.07)
Basophils Absolute: 0 10*3/uL (ref 0.0–0.1)
Basophils Relative: 0 %
Eosinophils Absolute: 0 10*3/uL (ref 0.0–0.5)
Eosinophils Relative: 0 %
HCT: 39.2 % (ref 39.0–52.0)
Hemoglobin: 13.7 g/dL (ref 13.0–17.0)
Immature Granulocytes: 0 %
Lymphocytes Relative: 11 %
Lymphs Abs: 1.5 10*3/uL (ref 0.7–4.0)
MCH: 30.9 pg (ref 26.0–34.0)
MCHC: 34.9 g/dL (ref 30.0–36.0)
MCV: 88.5 fL (ref 80.0–100.0)
Monocytes Absolute: 1.4 10*3/uL — ABNORMAL HIGH (ref 0.1–1.0)
Monocytes Relative: 10 %
Neutro Abs: 10.7 10*3/uL — ABNORMAL HIGH (ref 1.7–7.7)
Neutrophils Relative %: 79 %
Platelets: 331 10*3/uL (ref 150–400)
RBC: 4.43 MIL/uL (ref 4.22–5.81)
RDW: 13.5 % (ref 11.5–15.5)
WBC: 13.6 10*3/uL — ABNORMAL HIGH (ref 4.0–10.5)
nRBC: 0 % (ref 0.0–0.2)

## 2020-09-13 LAB — BASIC METABOLIC PANEL
Anion gap: 11 (ref 5–15)
BUN: 8 mg/dL (ref 6–20)
CO2: 22 mmol/L (ref 22–32)
Calcium: 8.3 mg/dL — ABNORMAL LOW (ref 8.9–10.3)
Chloride: 107 mmol/L (ref 98–111)
Creatinine, Ser: 0.9 mg/dL (ref 0.61–1.24)
GFR, Estimated: >60 mL/min (ref >60)
Glucose, Bld: 85 mg/dL (ref 70–99)
Potassium: 4 mmol/L (ref 3.5–5.1)
Sodium: 140 mmol/L (ref 135–145)

## 2020-09-13 LAB — MAGNESIUM: Magnesium: 1.9 mg/dL (ref 1.7–2.4)

## 2020-09-13 NOTE — Progress Notes (Signed)
[facility] SUR[facility]ICAL PROGRESS NOTE  Hospital Day(s): 1.   Post op day(s): 1 Day Post-Op.   Interval History:  Patient seen and examined No acute events or new complaints overnight.  Patient reports he is doing well, generalized soreness but "not as bad as he expected." No fever, chills, nausea, emesis He does have a leukocytosis to 13.6K this morning; likely reactive for recent surgery Hgb stable at 13.7 He has maintained his renal function; sCr - 0.90; UO - 900 ccs No electrolyte derangements Surgical drain with 80 ccs out; serosanguinous  No reports of flatus   Vital signs in last 24 hours: [min-max] current  Temp:  [97.7 F (36.5 C)-99.4 F (37.4 C)] 97.9 F (36.6 C) (05/11 0501) Pulse Rate:  [70-85] 85 (05/11 0501) Resp:  [11-18] 18 (05/11 0501) BP: (101-124)/(64-95) 103/64 (05/11 0501) SpO2:  [96 %-100 %] 98 % (05/11 0501)     Height: 5\' 10"  (177.8 cm) Weight: 77.1 kg BMI (Calculated): 24.39   Intake/Output last 2 shifts:  05/10 0701 - 05/11 0700 In: 3164.6 [I.V.:3039.6; IV Piggyback:100.1] Out: 880 [Urine:600; Drains:80; Blood:200]   Physical Exam:  Constitutional: alert, cooperative and no distress  Respiratory: breathing non-labored at rest  Cardiovascular: regular rate and sinus rhythm  Gastrointestinal: Soft, mild incisional soreness, non-distended, no rebound/guarding. Surgical drain with serosanguinous output Integumentary: Previous colostomy site and laparoscopic incisions are CDI with staples, no drainage on dressings  Labs:  CBC Latest Ref Rng & Units 09/13/2020 02/23/2020 02/22/2020  WBC 4.0 - 10.5 K/uL 13.6(H) 14.1(H) 15.2(H)  Hemoglobin 13.0 - 17.0 g/dL 02/24/2020 11.2(L) 10.6(L)  Hematocrit 39.0 - 52.0 % 39.2 33.3(L) 31.7(L)  Platelets 150 - 400 K/uL 331 556(H) 407(H)   CMP Latest Ref Rng & Units 09/13/2020 02/20/2020 02/19/2020  Glucose 70 - 99 mg/dL 85 02/21/2020) 176(H)  BUN 6 - 20 mg/dL 8 17 19   Creatinine 0.61 - 1.24 mg/dL 607(P 7.10   Sodium 135 - 145 mmol/L 140 145 143  Potassium 3.5 - 5.1 mmol/L 4.0 4.0 3.7  Chloride 98 - 111 mmol/L 107 109 106  CO2 22 - 32 mmol/L 22 29 27   Calcium 8.9 - 10.3 mg/dL 8.3(L) 8.1(L) 8.2(L)  Total Protein 6.5 - 8.1 g/dL - - -  Total Bilirubin 0.3 - 1.2 mg/dL - - -  Alkaline Phos 38 - 126 U/L - - -  AST 15 - 41 U/L - - -  ALT 0 - 44 U/L - - -     Imaging studies: No new pertinent imaging studies   Assessment/Plan: 45 y.o. male overall doing well, awaiting return of bowel function, 1 Day Post-Op s/p robotic assisted laparoscopic colostomy takedown, partial colectomy with anastomosis, and splenic flexure takedown.   - Will advance to CLD; awaiting bowel function return until further advancement  - Continue Entereg until return of bowel function  - Maintain foley x2 days; he did have stricture seen on cystoscopy   - Continue IVF support  - Monitor abdominal examination; on-going bowel function  - Pain control prn; antiemetics prn   - Mobilization as tolerated  - Monitor leukocytosis; likely reactive  All of the above findings and recommendations were discussed with the patient, patient's family (mother at bedside), and the medical team, and all of their questions were answered to their expressed satisfaction.  -- 9.48, PA-C [facility] 09/13/2020, 10:10 AM [phone] M-F: 7am - 4pm

## 2020-09-14 LAB — BASIC METABOLIC PANEL
Anion gap: 7 (ref 5–15)
BUN: 7 mg/dL (ref 6–20)
CO2: 26 mmol/L (ref 22–32)
Calcium: 8.3 mg/dL — ABNORMAL LOW (ref 8.9–10.3)
Chloride: 106 mmol/L (ref 98–111)
Creatinine, Ser: 0.78 mg/dL (ref 0.61–1.24)
GFR, Estimated: >60 mL/min (ref >60)
Glucose, Bld: 95 mg/dL (ref 70–99)
Potassium: 3.8 mmol/L (ref 3.5–5.1)
Sodium: 139 mmol/L (ref 135–145)

## 2020-09-14 LAB — CBC
HCT: 36.6 % — ABNORMAL LOW (ref 39.0–52.0)
Hemoglobin: 12.3 g/dL — ABNORMAL LOW (ref 13.0–17.0)
MCH: 29.9 pg (ref 26.0–34.0)
MCHC: 33.6 g/dL (ref 30.0–36.0)
MCV: 89.1 fL (ref 80.0–100.0)
Platelets: 267 10*3/uL (ref 150–400)
RBC: 4.11 MIL/uL — ABNORMAL LOW (ref 4.22–5.81)
RDW: 13.7 % (ref 11.5–15.5)
WBC: 10.2 10*3/uL (ref 4.0–10.5)
nRBC: 0 % (ref 0.0–0.2)

## 2020-09-14 LAB — SURGICAL PATHOLOGY

## 2020-09-14 MED ORDER — DIPHENHYDRAMINE HCL 12.5 MG/5ML PO ELIX
12.5000 mg | ORAL_SOLUTION | Freq: Every evening | ORAL | Status: DC | PRN
  Administered 2020-09-14: 12.5 mg via ORAL
  Filled 2020-09-14 (×3): qty 5

## 2020-09-14 NOTE — Progress Notes (Signed)
09/14/2020  Subjective: Patient is 2 Days Post-Op s/p robotic assisted colostomy takedown.  No acute events overnight.  Patient reports having a bowel movement and flatus last night.  Denies any significant pain.  Tolerating clear liquids.  Vital signs: Temp:  [97.9 F (36.6 C)-98.3 F (36.8 C)] 97.9 F (36.6 C) (05/12 0329) Pulse Rate:  [54-73] 73 (05/12 0329) Resp:  [14-20] 20 (05/12 0329) BP: (112-126)/(63-78) 126/78 (05/12 0329) SpO2:  [96 %-98 %] 96 % (05/12 0329)   Intake/Output: 05/11 0701 - 05/12 0700 In: 3570.2 [P.O.:1560; I.V.:1810.2; IV Piggyback:200] Out: 2850 [Urine:2800; Drains:50]    Physical Exam: Constitutional:  No acute distress Abdomen:  Soft, non-distended, appropriately tender.  Incisions clean, dry, intact, with dressings.  Drain with serosanguinous fluid.  Labs:  Recent Labs    09/13/20 0250 09/14/20 0254  WBC 13.6* 10.2  HGB 13.7 12.3*  HCT 39.2 36.6*  PLT 331 267   Recent Labs    09/13/20 0250 09/14/20 0254  NA 140 139  K 4.0 3.8  CL 107 106  CO2 22 26  GLUCOSE 85 95  BUN 8 7  CREATININE 0.90 0.78  CALCIUM 8.3* 8.3*   No results for input(s): LABPROT, INR in the last 72 hours.  Imaging: No results found.  Assessment/Plan: This is a 45 y.o. male s/p robotic assisted colostomy takedown.  --Patient is doing well post-operatively.  Will start full liquid diet today. --Discussed with Dr. Apolinar Junes and will d/c foley today --continue OOB, ambulate --d/c IV fluids    [name], MD [facility]

## 2020-09-15 MED ORDER — IBUPROFEN 800 MG PO TABS: 800.0000 mg | ORAL_TABLET | Freq: Three times a day (TID) | ORAL | 0 refills | Status: DC | PRN

## 2020-09-15 MED ORDER — OXYCODONE HCL 5 MG PO TABS: 5.0000 mg | ORAL_TABLET | ORAL | 0 refills | Status: DC | PRN

## 2020-09-15 NOTE — Discharge Instructions (Signed)
In addition to included general post-operative instructions,  Diet: Resume home diet.   Activity: No heavy lifting >20 pounds (children, pets, laundry, garbage) for 6 weeks, but light activity and walking are encouraged. Do not drive or drink alcohol if taking narcotic pain medications or having pain that might distract from driving.  Wound care: If you can keep drain site water proofed, you may shower/get incision wet with soapy water and pat dry (do not rub incisions), but no baths or submerging incision underwater until follow-up.   Medications: Resume all home medications. For mild to moderate pain: acetaminophen (Tylenol) or ibuprofen/naproxen (if no kidney disease). Combining Tylenol with alcohol can substantially increase your risk of causing liver disease. Narcotic pain medications, if prescribed, can be used for severe pain, though may cause nausea, constipation, and drowsiness. Do not combine Tylenol and Percocet (or similar) within a 6 hour period as Percocet (and similar) contain(s) Tylenol. If you do not need the narcotic pain medication, you do not need to fill the prescription.  Call office ([phone] / [phone]) at any time if any questions, worsening pain, fevers/chills, bleeding, drainage from incision site, or other concerns.  

## 2020-09-15 NOTE — Discharge Summary (Signed)
[facility] SUR[facility] DISCHARGE SUMMARY  Patient ID: Louis Singh MRN: [id number] DOB/AGE: [birth year] 43 y.o.  Admit date: 09/12/2020 Discharge date: 09/15/2020  Discharge Diagnoses Patient Active Problem List   Diagnosis Date Noted  . Colostomy in place [facility]) 09/12/2020  . Diverticulitis of colon with perforation   . Colostomy status (HCC)     Consultants None  Procedures 09/12/2020:  1.  Robotic assisted Colostomy takedown 2.  Robotic assisted splenic flexure mobilization 3.  Robotic assisted partial colectomy with anastomosis 4.  Rigid Proctosigmoidoscopy  HPI: Louis Singh is a 45 y.o. malewith a history of prior Hartmann's procedure in 2021 for perforated diverticulitis, and now presents for colostomy takedown and anastomosis. Colonoscopy did not reveal any other issues that would preclude surgery. The risks of bleeding, infection, bowel injury,need for diverting ostomy,and need for further procedures were all discussed with the patient and he was willing to proceed.   Hospital Course: Informed consent was obtained and documented, and patient underwent robotic assisted colostomy takedown with colorectal anastomosis (Dr Aleen Campi, 09/12/2020).  Post-operatively, patient did well and had return of bowel function on POD2. Advancement of patient's diet and ambulation were well-tolerated. The remainder of patient's hospital course was essentially unremarkable, and discharge planning was initiated accordingly with patient safely able to be discharged home with appropriate discharge instructions, pain control, and outpatient follow-up after all of his questions were answered to his expressed satisfaction.   Discharge Condition: Good    Allergies as of 09/15/2020      Reactions   Shellfish Allergy Nausea And Vomiting, Other (See Comments)   Ciprofloxacin Anxiety   Intolerance - increased anxiety    Metronidazole Anxiety   Intolerance- increases anxiety       Medication List    TAKE these medications   ibuprofen 800 MG tablet Commonly known as: ADVIL Take 1 tablet (800 mg total) by mouth every 8 (eight) hours as needed.   NUTRITIONAL SHAKE HIGH PROTEIN PO Take by mouth.   oxyCODONE 5 MG immediate release tablet Commonly known as: Oxy IR/ROXICODONE Take 1 tablet (5 mg total) by mouth every 4 (four) hours as needed for severe pain or breakthrough pain.   tetrahydrozoline 0.05 % ophthalmic solution Place 1 drop into both eyes daily as needed (dry eyes).         Follow-up Information    Henrene Dodge, MD. Schedule an appointment as soon as possible for a visit on 09/20/2020.   Specialty: General Surgery Why: s/p colostomy take down, has drain, has staples Contact information: [address] [place] [address] [phone]                Time spent on discharge management including discussion of hospital course, clinical condition, outpatient instructions, prescriptions, and follow up with the patient and members of the medical team: >30 minutes  -- Lynden Oxford , PA-C [facility]  09/15/2020, 1:43 PM [phone] M-F: 7am - 4pm

## 2020-09-15 NOTE — Plan of Care (Signed)
Continuing with plan of care. 

## 2020-09-15 NOTE — Progress Notes (Signed)
[facility] SUR[facility] PROGRESS NOTE  Hospital Day(s): 3.   Post op day(s): 3 Days Post-Op.   Interval History:  Patient seen and examined No acute events or new complaints overnight.  Patient reports he is overall doing good; requiring less pain medications No fever, chills, nausea, emesis He does interestingly have new blisters over his previous midline wound No new labs this morning Surgical drain with 30 ccs out He was able to tolerate full liquids yesterday; continues to endorse flatus and BM No issues with ambulation  Vital signs in last 24 hours: [min-max] current  Temp:  [97.6 F (36.4 C)-98.5 F (36.9 C)] 98 F (36.7 C) (05/13 0306) Pulse Rate:  [52-60] 57 (05/13 0306) Resp:  [14-20] 18 (05/13 0306) BP: (111-127)/(72-86) 115/82 (05/13 0306) SpO2:  [96 %-98 %] 98 % (05/13 0306)     Height: 5\' 10"  (177.8 cm) Weight: 77.1 kg BMI (Calculated): 24.39   Intake/Output last 2 shifts:  05/12 0701 - 05/13 0700 In: 2581.5 [P.O.:2340; I.V.:241.5] Out: 2230 [Urine:2200; Drains:30]   Physical Exam:  Constitutional: alert, cooperative and no distress  Respiratory: breathing non-labored at rest  Cardiovascular: regular rate and sinus rhythm  Gastrointestinal: Soft, mild incisional soreness, non-distended, no rebound/guarding. Surgical drain with serosanguinous output Integumentary: Previous colostomy site and laparoscopic incisions are CDI with staples, no drainage. He does appear to have blistering over his midline scar, question related to Tegaderm, these were popped with removal of tegaderms  Labs:  CBC Latest Ref Rng & Units 09/14/2020 09/13/2020 02/23/2020  WBC 4.0 - 10.5 K/uL 10.2 13.6(H) 14.1(H)  Hemoglobin 13.0 - 17.0 g/dL 12.3(L) 13.7 11.2(L)  Hematocrit 39.0 - 52.0 % 36.6(L) 39.2 33.3(L)  Platelets 150 - 400 K/uL 267 331 556(H)   CMP Latest Ref Rng & Units 09/14/2020 09/13/2020 02/20/2020  Glucose 70 - 99 mg/dL 95 85 02/22/2020)  BUN 6 - 20 mg/dL 7 8 17    Creatinine 0.61 - 1.24 mg/dL 725(D 6.64  Sodium 135 - 145 mmol/L 139 140 145  Potassium 3.5 - 5.1 mmol/L 3.8 4.0 4.0  Chloride 98 - 111 mmol/L 106 107 109  CO2 22 - 32 mmol/L 26 22 29   Calcium 8.9 - 10.3 mg/dL 8.3(L) 8.3(L) 8.1(L)  Total Protein 6.5 - 8.1 g/dL - - -  Total Bilirubin 0.3 - 1.2 mg/dL - - -  Alkaline Phos 38 - 126 U/L - - -  AST 15 - 41 U/L - - -  ALT 0 - 44 U/L - - -    Imaging studies: No new pertinent imaging studies   Assessment/Plan:  45 y.o. male overall doing well 3 Days Post-Op s/p robotic assisted laparoscopic colostomy takedown, partial colectomy with anastomosis, and splenic flexure takedown.   - Advance to soft diet  - Monitor abdominal examination; on-going bowel function             - Pain control prn; antiemetics prn              - Mobilization as tolerated      - Discharge Planning: If tolerates advancement of diet, potentially home this afternoon vs tomorrow morning   All of the above findings and recommendations were discussed with the patient, and the medical team, and all of patient's questions were answered to his expressed satisfaction.  -- 4.74, PA-C [facility] 09/15/2020, 7:30 AM [phone] M-F: 7am - 4pm

## 2020-09-15 NOTE — Plan of Care (Signed)
Discharge instructions completed with patient who is in stable condition.  Patient also return demonstrated emptying JP drain and care of site.

## 2020-09-19 LAB — FUNGUS CULTURE RESULT

## 2020-09-19 LAB — FUNGUS CULTURE WITH STAIN

## 2020-09-19 LAB — FUNGAL ORGANISM REFLEX

## 2020-09-20 ENCOUNTER — Ambulatory Visit (INDEPENDENT_AMBULATORY_CARE_PROVIDER_SITE_OTHER): Payer: Managed Care, Other (non HMO) | Admitting: Surgery

## 2020-09-20 ENCOUNTER — Other Ambulatory Visit: Payer: Self-pay

## 2020-09-20 ENCOUNTER — Encounter: Payer: Self-pay | Admitting: Surgery

## 2020-09-20 VITALS — BP 122/71 | HR 70 | Temp 98.0°F | Ht 70.0 in | Wt 165.2 lb

## 2020-09-20 DIAGNOSIS — Z933 Colostomy status: Secondary | ICD-10-CM

## 2020-09-20 DIAGNOSIS — Z09 Encounter for follow-up examination after completed treatment for conditions other than malignant neoplasm: Secondary | ICD-10-CM

## 2020-09-20 DIAGNOSIS — N35919 Unspecified urethral stricture, male, unspecified site: Secondary | ICD-10-CM

## 2020-09-20 NOTE — Progress Notes (Signed)
09/20/2020  HPI: Louis Singh is a 45 y.o. male s/p robotic colostomy takedown on 09/12/2020.  He presents today for follow-up.  He has a drain in the pelvis as well as staples for the incisions.  He reports that he has been doing well with no significant pain, tolerating soft diet, having bowel function, and without issues with the incisions.  Reports drain has been serous fluid and draining less fluid each day.  Of note during surgery, was found to have a urethral stricture which was dilated intraop.  Vital signs: BP 122/71   Pulse 70   Temp 98 F (36.7 C) (Oral)   Ht 5\' 10"  (1.778 m)   Wt 165 lb 3.2 oz (74.9 kg)   SpO2 96%   BMI 23.70 kg/m    Physical Exam: Constitutional: No acute distress Abdomen: Soft, nondistended, appropriately sore to palpation.  Incisions are clean, dry, intact with staples in place.  Drain has serous fluid.  There is 1 area of blistering that has open without any evidence of infection.  Drain and staples were removed at bedside without complications.  Staples were replaced by Steri-Strips.  Dry gauze dressing was applied over drain insertion site.  Applied Neosporin and dry gauze dressing to the open blister.  Assessment/Plan: This is a 45 y.o. male s/p robotic colostomy takedown.  - Staples and drain were removed today without complications. - Instructed the patient to apply Neosporin dry gauze dressing daily to the area of blistering once they open.  He may do a dry gauze dressing over the drain insertion site daily until its fully sealed. --Will send referral to Dr. 45 for follow up of urethral stricture. - Follow-up in 3 weeks to assess his progress.   Apolinar Junes, MD [facility]

## 2020-09-20 NOTE — Patient Instructions (Addendum)
Your drain and staples have been removed. Apply a dry dressing once a day. Avoid wearing a white t-shirt incase incision drains. You may use Neosporin as needed. If you start having back or pelvic pain, fever, or chills, please call our office.   A referral to Dr. Apolinar Junes has been placed. They will call you for an appointment.  If you have any concerns or questions, please feel free to call our office. See follow up appointment below.    Colostomy Reversal Surgery, Care After This sheet gives you information about how to care for yourself after your procedure. Your health care provider may also give you more specific instructions. If you have problems or questions, contact your health care provider. What can I expect after the procedure? After the procedure, it is common to have:  Pain and discomfort in your abdomen, especially near your incision.  Loose stools (diarrhea).  Decreased appetite.  Constipation. Follow these instructions at home: Activity  Do not lift anything that is heavier than 10 lb (4.5 kg), or the limit that you are told, until your health care provider says that it is safe.  Return to your normal activities as told by your health care provider. Ask your health care provider what activities are safe for you.  Avoid sitting for a long time without moving. Get up to take short walks every 1-2 hours. This is important to improve blood flow and breathing. Ask for help if you feel weak or unsteady.  Avoid strenuous activity, contact sports, and abdominal exercises for 4 weeks or as long as told by your health care provider.   Incision care  Follow instructions from your health care provider about how to take care of your incision. Make sure you: ? Wash your hands with soap and water before and after you change your bandage (dressing). If soap and water are not available, use hand sanitizer. ? Change your dressing as told by your health care provider. ? Leave stitches  (sutures), skin glue, or adhesive strips in place. These skin closures may need to stay in place for 2 weeks or longer. If adhesive strip edges start to loosen and curl up, you may trim the loose edges. Do not remove adhesive strips completely unless your health care provider tells you to do that.  Keep the incision area clean and dry.  Check your incision area every day for signs of infection. Check for: ? More redness, swelling, or pain. ? More fluid or blood. ? Warmth. ? Pus or a bad smell.   Bathing  Do not take baths, swim, or use a hot tub until your health care provider approves. Ask your health care provider if you may take showers. You may only be allowed to take sponge baths.  If your health care provider approves bathing and showering, cover the dressing with a watertight covering to protect it from water. Do not let the dressing get wet.  Keep the dressing dry until your health care provider says it can be removed. Driving  Do not drive for 24 hours if you were given a sedative during your procedure. Follow other driving restrictions as told by your health care provider.  Do not drive or use heavy machinery while taking prescription pain medicine. Eating and drinking  Follow instructions from your health care provider about eating or drinking restrictions. This may include: ? What to eat and drink. You may be told to start eating a bland diet. Over time, you may slowly resume  a more normal, healthy diet. ? How much to eat and drink. You should eat small meals often and stop eating when you feel full.  Take nutrition supplements as told by your health care provider or dietitian. General instructions  Take over-the-counter and prescription medicines only as told by your health care provider.  Take steps to treat diarrhea or constipation as told by your health care provider. Your health care provider may recommend that you: ? Drink enough fluid to keep your urine pale  yellow. Avoid fluids that contain a lot of sugar or caffeine, such as energy drinks, sports drinks, and soda. ? Eat bland, easy-to-digest foods in small amounts as you are able. These foods include bananas, applesauce, rice, lean meats, toast, and crackers. ? Take over-the-counter or prescription medicines. ? Limit foods that are high in fat and processed sugars, such as fried or sweet foods.  Do not use any products that contain nicotine or tobacco, such as cigarettes, e-cigarettes, and chewing tobacco. These can delay incision healing after surgery. If you need help quitting, ask your health care provider.  Keep all follow-up visits as told by your health care provider. This is important. Contact a health care provider if:  You have more redness, swelling, or pain at the site of your incision.  You have more fluid or blood coming from your incision.  Your incision feels warm to the touch.  You have pus or a bad smell coming from your incision.  You have a fever.  Your incision breaks open.  You feel nauseous.  You are not able to have a bowel movement (are constipated).  Your diarrhea gets worse.  You have pain that is not controlled with medicine. Get help right away if you have:  Abdominal pain that does not go away or becomes severe.  Frequent vomiting and you are not able to eat or drink.  Difficulty breathing. Summary  After colostomy reversal surgery, it is common to have abdominal pain, decreased appetite, diarrhea, or constipation.  Follow instructions from your health care provider about how to take care of your incision. Do not let the dressing get wet.  Take over-the-counter and prescription medicines only as told by your health care provider.  Contact your health care provider if you are not able to have a bowel movement (are constipated).  Keep all follow-up visits as told by your health care provider. This is important. This information is not intended  to replace advice given to you by your health care provider. Make sure you discuss any questions you have with your health care provider. Document Revised: 10/08/2017 Document Reviewed: 10/08/2017 Elsevier Patient Education  2021 [employer].

## 2020-10-03 LAB — ACID FAST CULTURE WITH REFLEXED SENSITIVITIES (MYCOBACTERIA): Acid Fast Culture: NEGATIVE

## 2020-10-05 ENCOUNTER — Ambulatory Visit (INDEPENDENT_AMBULATORY_CARE_PROVIDER_SITE_OTHER): Payer: Managed Care, Other (non HMO) | Admitting: Urology

## 2020-10-05 ENCOUNTER — Other Ambulatory Visit: Payer: Self-pay

## 2020-10-05 VITALS — BP 123/84 | HR 71 | Ht 70.0 in | Wt 165.0 lb

## 2020-10-05 DIAGNOSIS — N35919 Unspecified urethral stricture, male, unspecified site: Secondary | ICD-10-CM

## 2020-10-05 LAB — BLADDER SCAN AMB NON-IMAGING: Scan Result: 28

## 2020-10-05 NOTE — Progress Notes (Signed)
10/05/2020 10:57 AM   Louis Singh July 21, 1975 [id number]  Referring provider: Henrene Dodge, MD [address] [place],  [place] [address]  Chief Complaint  Patient presents with  . Urethral Stricture    HPI: 45 year old male who presents today for follow-up.  He underwent colostomy takedown with partial colectomy with Dr. Aleen Campi on 09/13/2018.  I was asked to assist during the procedure in the form of injection of intraoperative ICG for the purpose of ureteral identification.  Incidentally, the patient was noted to have a mild bulbar urethral stricture at the time of the procedure, approximately 12 French in diameter which I was able to traverse with the scope.  A Foley catheter was left in place for 72 hours postoperatively.  This was removed prior to his discharge.  He reports that after the initial Foley removal, he did well for a couple of weeks and then his symptoms started to recur.  He receives been struggling with weak urinary stream, spraying of his urinary stream is since at least his 20s.  He is now starting to experience this again.  He denies any dysuria or gross hematuria.  He is healing well from his multiple bowel surgeries.  Taken a lot out of him.     IPSS    Row Name 10/05/20 1000         International Prostate Symptom Score   How often have you had the sensation of not emptying your bladder? Less than 1 in 5     How often have you had to urinate less than every two hours? Less than 1 in 5 times     How often have you found you stopped and started again several times when you urinated? Less than 1 in 5 times     How often have you found it difficult to postpone urination? Less than 1 in 5 times     How often have you had a weak urinary stream? More than half the time     How often have you had to strain to start urination? Less than half the time     How many times did you typically get up at night to urinate? 2 Times     Total IPSS  Score 12           Quality of Life due to urinary symptoms   If you were to spend the rest of your life with your urinary condition just the way it is now how would you feel about that? Unhappy            Score:  1-7 Mild 8-19 Moderate 20-35 Severe    PMH: Past Medical History:  Diagnosis Date  . Diverticulosis   . GERD (gastroesophageal reflux disease)   . Tobacco abuse     Surgical History: Past Surgical History:  Procedure Laterality Date  . COLECTOMY WITH COLOSTOMY CREATION/HARTMANN PROCEDURE N/A 02/17/2020   Procedure: COLECTOMY WITH COLOSTOMY CREATION/HARTMANN PROCEDURE;  Surgeon: Henrene Dodge, MD;  Location: [facility];  Service: General;  Laterality: N/A;  . COLONOSCOPY WITH PROPOFOL N/A 08/18/2020   Procedure: COLONOSCOPY WITH PROPOFOL;  Surgeon: Pasty Spillers, MD;  Location: [facility];  Service: Endoscopy;  Laterality: N/A;  . CYSTOSCOPY N/A 09/12/2020   Procedure: CYSTOSCOPY;  Surgeon: Vanna Scotland, MD;  Location: [facility];  Service: Urology;  Laterality: N/A;  . facial recontruction  1984  . XI ROBOTIC ASSISTED COLOSTOMY TAKEDOWN N/A 09/12/2020   Procedure: XI ROBOTIC ASSISTED COLOSTOMY  TAKEDOWN with Lincoln Brigham PA-C to assist;  Surgeon: Henrene Dodge, MD;  Location: [facility];  Service: General;  Laterality: N/A;  Provider requesting 4 hours / 240 minutes for procedure.    Home Medications:  Allergies as of 10/05/2020      Reactions   Shellfish Allergy Nausea And Vomiting, Other (See Comments)   Ciprofloxacin Anxiety   Intolerance - increased anxiety    Metronidazole Anxiety   Intolerance- increases anxiety      Medication List       Accurate as of October 05, 2020 10:57 AM. If you have any questions, ask your nurse or doctor.        STOP taking these medications   ibuprofen 800 MG tablet Commonly known as: ADVIL Stopped by: Vanna Scotland, MD   NUTRITIONAL SHAKE HIGH PROTEIN PO Stopped by: Vanna Scotland, MD   oxyCODONE 5 MG immediate  release tablet Commonly known as: Oxy IR/ROXICODONE Stopped by: Vanna Scotland, MD     TAKE these medications   tetrahydrozoline 0.05 % ophthalmic solution Place 1 drop into both eyes daily as needed (dry eyes).       Allergies:  Allergies  Allergen Reactions  . Shellfish Allergy Nausea And Vomiting and Other (See Comments)  . Ciprofloxacin Anxiety    Intolerance - increased anxiety   . Metronidazole Anxiety    Intolerance- increases anxiety    Family History: Family History  Problem Relation Age of Onset  . Diabetes Mellitus II Father   . Diverticulitis Sister     Social History:  reports that he quit smoking about 4 years ago. He has never used smokeless tobacco. He reports current alcohol use. He reports that he does not use drugs.   Physical Exam: BP 123/84   Pulse 71   Ht 5\' 10"  (1.778 m)   Wt 165 lb (74.8 kg)   BMI 23.68 kg/m   Constitutional:  Alert and oriented, No acute distress. HEENT: Man AT, moist mucus membranes.  Trachea midline, no masses. Cardiovascular: No clubbing, cyanosis, or edema. Respiratory: Normal respiratory effort, no increased work of breathing. Skin: No rashes, bruises or suspicious lesions. Neurologic: Grossly intact, no focal deficits, moving all 4 extremities. Psychiatric: Normal mood and affect.  Laboratory Data: Lab Results  Component Value Date   WBC 10.2 09/14/2020   HGB 12.3 (L) 09/14/2020   HCT 36.6 (L) 09/14/2020   MCV 89.1 09/14/2020   PLT 267 09/14/2020    Lab Results  Component Value Date   CREATININE 0.78 09/14/2020   Urinalysis Results for orders placed or performed in visit on 10/05/20  Microscopic Examination   Urine  Result Value Ref Range   WBC, UA 0-5 0 - 5 /hpf   RBC 0-2 0 - 2 /hpf   Epithelial Cells (non renal) 0-10 0 - 10 /hpf   Bacteria, UA None seen None seen/Few  Urinalysis, Complete  Result Value Ref Range   Specific Gravity, UA 1.020 1.005 - 1.030   pH, UA 6.0 5.0 - 7.5   Color, UA Yellow  Yellow   Appearance Ur Clear Clear   Leukocytes,UA Negative Negative   Protein,UA Negative Negative/Trace   Glucose, UA Negative Negative   Ketones, UA Negative Negative   RBC, UA Trace (A) Negative   Bilirubin, UA Negative Negative   Urobilinogen, Ur 0.2 0.2 - 1.0 mg/dL   Nitrite, UA Negative Negative   Microscopic Examination See below:   BLADDER SCAN AMB NON-IMAGING  Result Value Ref Range   Scan  Result 28 ml       Assessment & Plan:    1. Stricture of male urethra, bulbar Incidental bulbar urethral stricture identified at the time of cystoscopy as outlined above  Symptoms have been longstanding prior to the aforementioned procedure and now recurrent, highly suspicious for recurrent bulbar urethral stricture  He would like to avoid any further surgical or invasive procedures for the time being and does not think that he ever consider urethroplasty at least in the short-term.  We discussed reassessing his symptoms in about 6 weeks or sooner as needed.  If he still having spraying of his urinary stream as well as weak stream, will consider office cystoscopy to confirm that the stricture has recurred.  I also offered him referral to [facility] for consideration of urethroplasty but he declined.  We did discuss alternatives including DVIU versus balloon dilation and a new dilation balloon impregnated with drug, UroLume as alternatives.  He may be interested in that.  We discussed this further depending on his symptoms.  He is agreeable this plan.  No evidence of infection today, adequate bladder emptying - Urinalysis, Complete - BLADDER SCAN AMB NON-IMAGING  Vanna Scotland, MD  [facility] [address] [place], [place] [address] [phone]

## 2020-10-06 LAB — URINALYSIS, COMPLETE
Bilirubin, UA: NEGATIVE
Glucose, UA: NEGATIVE
Ketones, UA: NEGATIVE
Leukocytes,UA: NEGATIVE
Nitrite, UA: NEGATIVE
Protein,UA: NEGATIVE
Specific Gravity, UA: 1.02 (ref 1.005–1.030)
Urobilinogen, Ur: 0.2 mg/dL (ref 0.2–1.0)
pH, UA: 6 (ref 5.0–7.5)

## 2020-10-06 LAB — MICROSCOPIC EXAMINATION: Bacteria, UA: NONE SEEN

## 2020-10-09 ENCOUNTER — Ambulatory Visit: Payer: Managed Care, Other (non HMO)

## 2020-10-10 ENCOUNTER — Telehealth: Payer: Self-pay | Admitting: Surgery

## 2020-10-10 NOTE — Telephone Encounter (Signed)
Sent letter to pt's mychart. Pt notified.

## 2020-10-10 NOTE — Telephone Encounter (Signed)
Patient is calling and is asking for a doctors note to return to work on June 27th, and can be sent on mychart. Please call patient if any questions.

## 2020-10-16 ENCOUNTER — Encounter: Payer: Managed Care, Other (non HMO) | Admitting: Surgery

## 2020-10-17 ENCOUNTER — Telehealth: Payer: Self-pay | Admitting: *Deleted

## 2020-10-17 NOTE — Telephone Encounter (Signed)
Faxed FMLA to [employer] to [phone]

## 2020-11-08 ENCOUNTER — Encounter: Payer: Self-pay | Admitting: Surgery

## 2020-11-08 ENCOUNTER — Ambulatory Visit (INDEPENDENT_AMBULATORY_CARE_PROVIDER_SITE_OTHER): Payer: Managed Care, Other (non HMO) | Admitting: Surgery

## 2020-11-08 ENCOUNTER — Other Ambulatory Visit: Payer: Self-pay

## 2020-11-08 VITALS — BP 110/75 | HR 67 | Temp 98.2°F | Ht 70.0 in | Wt 167.8 lb

## 2020-11-08 DIAGNOSIS — Z9889 Other specified postprocedural states: Secondary | ICD-10-CM

## 2020-11-08 DIAGNOSIS — Z09 Encounter for follow-up examination after completed treatment for conditions other than malignant neoplasm: Secondary | ICD-10-CM

## 2020-11-08 NOTE — Patient Instructions (Addendum)
If you have any concerns or questions, please feel free to call our office. Follow up as needed.    Core Strength Exercises Ask your health care provider which exercises are safe for you. Do exercises exactly as told by your health care provider and adjust them as directed. It is normal to feel mild stretching, pulling, tightness, or discomfort as you do these exercises. Stop right away if you feel sudden pain or your pain gets worse. Do not begin these exercises until told by your health care provider. Benefits of core strength exercises Core exercises help to build strength in the muscles between your ribs and your hips (abdominal muscles). These muscles help to support your body and keep your spine stable. It isimportant to maintain strength in your core to prevent injury and pain. Some activities, such as yoga and Pilates, can help to strengthen core muscles. You can also strengthen core muscles with exercises at home. It is important totalk to your health care provider before you start a new exercise routine. Core strength exercises can: Reduce back pain. Help to rebuild strength after a back or spine injury. Help to prevent injury during physical activity, especially injuries to the back, hips, and knees. How to do core strength exercises Repeat these exercises 10-15 times, or until you are tired. Stop if you feel any pain while doing these exercises. Contact your health care provider if yourpain continues or gets worse while doing or after doing core strength exercises. For strength exercises that are done on the floor, use a padded yoga mat or anexercise mat. Bridging  Lie on your back on a firm surface with your knees bent and your feet flat on the floor. Raise your hips so that your knees, hips, and shoulders together form a straight line. Do not excessively arch your back. Keep your abdominal muscles tight. Hold this position for 3-5 seconds. Slowly lower your hips to the starting  position. Let your muscles relax completely between repetitions.  Single-leg bridge Lie on your back on a firm surface with your knees bent and your feet flat on the floor. Raise your hips so that your knees, hips, and shoulders together form a straight line. Do not excessively arch your back. Keep your abdominal muscles tight. Lift one foot off the floor while maintaining alignment in your knees, hips, and shoulders. Then, completely straighten the lifted leg. Hold this position for 3-5 seconds. Put the straight leg back down in the bent position. Slowly lower your hips to the starting position. Repeat these steps using your other leg. Side bridge Lie on your side with your knees bent. Prop yourself up on the elbow that is near the floor. Using your abdominal muscles and the elbow you are propped up on, raise your body off the floor. Raise your hip so that your shoulder, hip, and foot together form a straight line. Hold this position for 10 seconds. Keep your head and neck raised and away from your shoulder (in their normal, neutral position). Keep your abdominal muscles tight. Slowly lower your hip to the starting position. Repeat and try to hold this position longer, working your way up to 30 seconds. Abdominal crunch Lie on your back on a firm surface. Bend your knees and keep your feet flat on the floor. Cross your arms over your chest. Without bending your neck, tip your chin slightly toward your chest. Tighten your abdominal muscles as you lift your chest just high enough to lift your shoulder blades off  the floor. Do not hold your breath. You can do this with short lifts or long lifts. Slowly return to the starting position. Bird dog  Get on your hands and knees, with your legs shoulder-width apart and your arms under your shoulders. Keep your back straight. Tighten your abdominal muscles. Raise one of your legs off the floor and straighten it. Try to keep it parallel to the  floor. Slowly lower your leg to the starting position. Raise one of your arms off the floor and straighten it. Try to keep it parallel to the floor. Slowly lower your arm to the starting position. Repeat with the other arm and leg. If possible, try raising a leg and an arm at the same time, on opposite sides of the body. For example, raise your left hand and your right leg.  Raford Pitcher on your belly. Prop up your body onto your forearms and your feet, keeping your legs straight. Your body should make a straight line between your shoulders and feet. Hold this position for 10 seconds while keeping your abdominal muscles tight. Lower your body to the starting position. Repeat and try to hold this position longer, working your way up to 30 seconds.  Cross-core strengthening Stand with your feet shoulder-width apart. Hold a ball out in front of you. Keep your arms straight. Tighten your abdominal muscles and slowly rotate at your waist from side to side. Keep your feet flat. Once you are comfortable, try repeating this exercise with a heavier ball. Top core strengthening Stand about 18 inches (46 cm) out from a wall, with your back to the wall. Keep your feet flat and shoulder-width apart. Tighten your abdominal muscles. Bend your hips and knees. Slowly reach between your legs to touch the wall behind you. Slowly stand back up. Raise your arms over your head and reach behind you. Return to the starting position. General tips Do not do any exercises that cause pain. If you have pain while exercising, talk to your health care provider. Always stretch before and after doing these exercises. This can help prevent injury. Maintain a healthy weight. Ask your health care provider what weight is healthy for you. Contact a health care provider if: You have back pain that gets worse or does not go away. You feel pain while doing core strength exercises. Get help right away if: You have severe  pain that does not get better with medicine. Summary Core exercises help to build strength in the muscles between your ribs and your waist. Core muscles help to support your body and keep your spine stable. Some activities, such as yoga and Pilates, can help to strengthen core muscles. Core strength exercises can help back pain and can prevent injury. Stop if you feel any pain while doing core strength exercises. This information is not intended to replace advice given to you by your health care provider. Make sure you discuss any questions you have with your healthcare provider. Document Revised: 01/18/2020 Document Reviewed: 01/18/2020 Elsevier Patient Education  2022 [employer] Inc.     Diastasis Recti  Diastasis recti is a condition in which the muscles of the abdomen (rectus abdominis muscles) become thin and separate. The result is a wider space between the muscles of the right and left abdomen (abdominal muscles). This wider space between the muscles may cause a bulge in the middle of the abdomen. This bulge may be noticed when a person is straining or when he or shesits up after lying down.  Diastasis recti can affect men and women. It is most common among pregnant women, babies, people with obesity, and people who have had abdominal surgery.Exercise or surgery may help correct this condition. What are the causes? Common causes of this condition include: Pregnancy. As the uterus grows in size, it puts pressure on the abdominal muscles, causing the muscles to separate. Obesity. Excess fat puts pressure on abdominal muscles. Weight lifting. Some exercises of the abdomen. Advanced age. Genetics. Having had surgery on the abdomen before. What increases the risk? This condition is more likely to develop in: Women. Newborns, especially newborns who are born early (prematurely). What are the signs or symptoms? Common symptoms of this condition include: A bulge in the middle of your  abdomen. You will notice it most when you sit up or strain. Pain in your low back, hips, or the area between your hip bones (pelvis). Constipation. Being unable to control when you urinate (urinary incontinence). Bloating. Poor posture. How is this diagnosed? This condition is diagnosed with a physical exam. During the exam, your health care provider will ask you to lie flat on your back and do a crunch or half sit-up. If you have diastasis recti, a bulge will appear lengthwise between your abdominal muscles in the center of your abdomen. Your health care provider will measure the gap between your muscles with one of the following: A medical device used to measure the space between two objects (caliper). A tape measure. CT scan. Ultrasound. Finger spaces. Your health care provider will measure the space using his or her fingers. How is this treated? If your muscle separation is not too large, you may not need treatment. However, if you are a woman who plans to become pregnant again, you should treat this condition before your next pregnancy. Treatment may include: Physical therapy exercises to strengthen and tighten your abdominal muscles. Lifestyle changes such as weight loss and exercise. Over-the-counter pain medicines as needed. Surgery to correct the separation. Follow these instructions at home: Activity Return to your normal activities as told by your health care provider. Ask your health care provider what activities are safe for you. Do exercises as told by your health care provider. Make sure you are doing your exercises and movements correctly when lifting weights or doing exercises using your abdominal muscles or the muscles in the center of your body that give stability (core muscles). Proper form can help to prevent this condition from happening again. General instructions If you are overweight, ask your health care provider for help with weight loss. Losing even a small amount  of weight can help to improve your diastasis recti. Take over-the-counter or prescription medicines only as told by your health care provider. Do not strain. Straining can make the separation worse. Examples of straining include: Pushing hard to have a bowel movement, such as when you have constipation. Lifting heavy objects or lifting children. Standing up and sitting down. You may need to take these actions to prevent or treat constipation: Drink enough fluid to keep your urine pale yellow. Take over-the-counter or prescription medicines. Eat foods that are high in fiber, such as beans, whole grains, and fresh fruits and vegetables. Limit foods that are high in fat and processed sugars, such as fried or sweet foods. Keep all follow-up visits. This is important. Contact a health care provider if: You notice a new bulge in your abdomen. Get help right away if: You experience severe discomfort in your abdomen. You develop severe abdominal pain  along with nausea, vomiting, or a fever. Summary Diastasis recti is a condition in which the muscles of the abdomen (rectus abdominismuscles) become thin and separate. You may notice a bulge in your abdomen because the space has widened between the muscles of the right and left abdomen. The most common symptom is a bulge in the middle of your abdomen. You will notice it most when you sit up or strain. This condition is diagnosed with a physical exam. If the muscle separation is not too big, you may not need treatment. Otherwise, you may need to do physical therapy or have surgery. This information is not intended to replace advice given to you by your health care provider. Make sure you discuss any questions you have with your healthcare provider. Document Revised: 12/24/2019 Document Reviewed: 12/24/2019 Elsevier Patient Education  2022 [employer].

## 2020-11-08 NOTE — Progress Notes (Signed)
11/08/2020  HPI: Louis Singh is a 45 y.o. male s/p robotic assisted colostomy reversal on 09/12/20.  He presents today for follow up.  He has gone back to work and he's been doing well.  He reports some upper abdominal soreness at the end of the day from standing up all day.  He also reports some occasional indigestion.  He's been taking fiber 3 times per week.  Denies any worsening pain, issues with the incisions.  Vital signs: BP 110/75   Pulse 67   Temp 98.2 F (36.8 C) (Oral)   Ht 5\' 10"  (1.778 m)   Wt 167 lb 12.8 oz (76.1 kg)   SpO2 95%   BMI 24.08 kg/m    Physical Exam: Constitutional:  No acute distress Abdomen:  Soft, non-distended, with some appropriate discomfort to palpation.  The patient has post-op diastasis recti involving the superior 2/3 of the midline incision.  There is no true hernia on exam, but he does have some discomfort to palpation at the umbilicus.  No evidence of hernia at prior ostomy site, and all his new incisions are healing well.  Assessment/Plan: This is a 45 y.o. male s/p robotic colostomy reversal.  --Discussed with the patient that he has diastasis recti following his initial laparotomy.  There is no true hernia.  I did recommend that he can do core exercises to strengthen the core to help with any possible issues that the diastasis recti could contribute to.  However, these exercises do not correct the diastasis, and only surgical plication can do that.  However, the patient is not interested in surgery at this time. --OK to continue adding fiber to his diet.  May take Pepto or maalox as needed for any indigestion issues. --His body is still recovering from surgery and regaining strength back.  Reassured the patient that the healing is going appropriately. --Follow up prn.   57, MD [facility]

## 2020-11-14 ENCOUNTER — Telehealth: Payer: Self-pay | Admitting: Urology

## 2020-11-14 NOTE — Telephone Encounter (Signed)
Patient called and left a voicemail to cx his cysto and did not want to reschd. York Spaniel he was feeling better  [employer]

## 2020-11-16 ENCOUNTER — Other Ambulatory Visit: Payer: Managed Care, Other (non HMO) | Admitting: Urology

## 2021-01-11 ENCOUNTER — Telehealth: Payer: Self-pay | Admitting: Surgery

## 2021-01-11 NOTE — Telephone Encounter (Signed)
Per Dr Aleen Campi patient should go to either an urgent care or the ER to have this done. I spoke with the patient and he is in agreement with this.

## 2021-01-11 NOTE — Telephone Encounter (Signed)
Pt called w/concerns about his breathing & cannot seem to get any help from his pulmonologist office, Dr. Jinny Sanders, as he is out of the office for @ least the next 7-10 days.  Dr. Karna Christmas has seen/tx'd him in the past for PE, which has included pulling fluid from his lungs & the pt fears that he has more building up but cannot get an order for a CXR because the provider is not in the office & the pt must be seen before, as well.  When asked, Genevie Cheshire did indicate that he is in the process of changing PCP's from one @ [facility] to a family medicine provider that he has not established w/yet, which is why he is not asking them for this order.  Genevie Cheshire is reaching out to this office in hopes that possibly Dr. Aleen Campi would be able to enter the order so that the pt is not having to wait 2+ wks when he does not feel he can wait that long (having challenges breathing as it is).  *Dr. Aleen Campi performed the pt's colostomy takedown back in May 2022 & the pt feels he is aware of the issues Genevie Cheshire has had to deal w/on the road to full recovery.  The pt was advised that this office/Dr. Piscoya may not be able to provide this order since he has not been a part of that portion of his care, however a msg is being sent through the clinical team to @ least ask.  Furthermore, Genevie Cheshire is aware Dr. Aleen Campi is in the OR today & there is no set time when a reply will be received.  He acknowledged understanding & will await a call back.  Thank you

## 2021-04-18 ENCOUNTER — Other Ambulatory Visit: Payer: Self-pay | Admitting: Pulmonary Disease

## 2021-04-18 DIAGNOSIS — R7989 Other specified abnormal findings of blood chemistry: Secondary | ICD-10-CM

## 2021-04-19 ENCOUNTER — Ambulatory Visit: Payer: Managed Care, Other (non HMO)

## 2022-03-05 ENCOUNTER — Other Ambulatory Visit: Payer: Self-pay | Admitting: Pulmonary Disease

## 2022-03-05 DIAGNOSIS — J90 Pleural effusion, not elsewhere classified: Secondary | ICD-10-CM

## 2022-03-06 ENCOUNTER — Other Ambulatory Visit: Payer: Self-pay | Admitting: Pulmonary Disease

## 2022-03-06 ENCOUNTER — Ambulatory Visit
Admission: RE | Admit: 2022-03-06 | Discharge: 2022-03-06 | Disposition: A | Discharge status: Home / Self Care | Payer: Managed Care, Other (non HMO) | Source: Ambulatory Visit | Attending: Pulmonary Disease | Admitting: Pulmonary Disease

## 2022-03-06 DIAGNOSIS — J90 Pleural effusion, not elsewhere classified: Secondary | ICD-10-CM | POA: Diagnosis present

## 2023-06-24 NOTE — Progress Notes (Unsigned)
 06/25/2023 10:38 AM   Louis Singh 01-07-1976 [id number]  Referring provider: Kandyce Rud, MD [phone] [address] [facility] [place],  [place] [address]  Urological history: 1. Urethral stricture -found incidentally during ureteral injection of ICG for the purpose of ureteral identification during a colostomy takedown (2022) -soft bulbar urethral stricture ~ 12 Fr  No chief complaint on file.  HPI: Louis Singh is a 48 y.o. male who presents today for tenderness in the right testicle.    Previous records reviewed.   PMH: Past Medical History:  Diagnosis Date   Diverticulosis    GERD (gastroesophageal reflux disease)    Tobacco abuse     Surgical History: Past Surgical History:  Procedure Laterality Date   COLECTOMY WITH COLOSTOMY CREATION/HARTMANN PROCEDURE N/A 02/17/2020   Procedure: COLECTOMY WITH COLOSTOMY CREATION/HARTMANN PROCEDURE;  Surgeon: Henrene Dodge, MD;  Location: [facility] ORS;  Service: General;  Laterality: N/A;   COLONOSCOPY WITH PROPOFOL N/A 08/18/2020   Procedure: COLONOSCOPY WITH PROPOFOL;  Surgeon: Pasty Spillers, MD;  Location: [facility] ENDOSCOPY;  Service: Endoscopy;  Laterality: N/A;   CYSTOSCOPY N/A 09/12/2020   Procedure: CYSTOSCOPY;  Surgeon: Vanna Scotland, MD;  Location: [facility] ORS;  Service: Urology;  Laterality: N/A;   facial recontruction  1984   XI ROBOTIC ASSISTED COLOSTOMY TAKEDOWN N/A 09/12/2020   Procedure: XI ROBOTIC ASSISTED COLOSTOMY TAKEDOWN with Lincoln Brigham PA-C to assist;  Surgeon: Henrene Dodge, MD;  Location: [facility] ORS;  Service: General;  Laterality: N/A;  Provider requesting 4 hours / 240 minutes for procedure.    Home Medications:  Allergies as of 06/25/2023       Reactions   Shellfish Allergy Nausea And Vomiting, Other (See Comments)   Ciprofloxacin Anxiety   Intolerance - increased anxiety    Metronidazole Anxiety   Intolerance- increases anxiety         Medication List        Accurate as of June 24, 2023 10:38 AM. If you have any questions, ask your nurse or doctor.          tetrahydrozoline 0.05 % ophthalmic solution Place 1 drop into both eyes daily as needed (dry eyes).        Allergies:  Allergies  Allergen Reactions   Shellfish Allergy Nausea And Vomiting and Other (See Comments)   Ciprofloxacin Anxiety    Intolerance - increased anxiety    Metronidazole Anxiety    Intolerance- increases anxiety    Family History: Family History  Problem Relation Age of Onset   Diabetes Mellitus II Father    Diverticulitis Sister     Social History:  reports that he quit smoking about 7 years ago. His smoking use included cigarettes. He has never used smokeless tobacco. He reports current alcohol use. He reports that he does not use drugs.  ROS: Pertinent ROS in HPI  Physical Exam: There were no vitals taken for this visit.  Constitutional:  Well nourished. Alert and oriented, No acute distress. HEENT: [place] AT, moist mucus membranes.  Trachea midline, no masses. Cardiovascular: No clubbing, cyanosis, or edema. Respiratory: Normal respiratory effort, no increased work of breathing. GI: Abdomen is soft, non tender, non distended, no abdominal masses. Liver and spleen not palpable.  No hernias appreciated.  Stool sample for occult testing is not indicated.   GU: No CVA tenderness.  No bladder fullness or masses.  Patient with circumcised/uncircumcised phallus. ***Foreskin easily retracted***  Urethral meatus is patent.  No penile discharge. No penile lesions or rashes. Scrotum without lesions, cysts, rashes and/or edema.  Testicles are located scrotally bilaterally. No masses are appreciated in the testicles. Left and right epididymis are normal. Rectal: Patient with  normal sphincter tone. Anus and perineum without scarring or rashes. No rectal masses are appreciated. Prostate is approximately *** grams, *** nodules are  appreciated. Seminal vesicles are normal. Skin: No rashes, bruises or suspicious lesions. Lymph: No cervical or inguinal adenopathy. Neurologic: Grossly intact, no focal deficits, moving all 4 extremities. Psychiatric: Normal mood and affect.  Laboratory Data: Urinalysis See EPIC and HPI  I have reviewed the labs.   Pertinent Imaging: N/A  Assessment & Plan:  ***  1. Right testicular pain -UA *** -Urine culture, atypicals and GC chlamydia pending  2. Urethral stricture -PVR demonstrates adequate emptying ***  No follow-ups on file.  These notes generated with voice recognition software. I apologize for typographical errors.  Cloretta Ned  [facility] [address]  [address] [place], [place] [address] [phone]

## 2023-06-25 ENCOUNTER — Encounter: Payer: Self-pay | Admitting: Urology

## 2023-06-25 ENCOUNTER — Ambulatory Visit (INDEPENDENT_AMBULATORY_CARE_PROVIDER_SITE_OTHER): Payer: Managed Care, Other (non HMO) | Admitting: Urology

## 2023-06-25 VITALS — BP 113/70 | HR 66

## 2023-06-25 DIAGNOSIS — R300 Dysuria: Secondary | ICD-10-CM | POA: Diagnosis not present

## 2023-06-25 DIAGNOSIS — N50811 Right testicular pain: Secondary | ICD-10-CM

## 2023-06-25 DIAGNOSIS — N35919 Unspecified urethral stricture, male, unspecified site: Secondary | ICD-10-CM

## 2023-06-25 LAB — BLADDER SCAN AMB NON-IMAGING: Scan Result: 22

## 2023-06-25 LAB — URINALYSIS, COMPLETE
Bilirubin, UA: NEGATIVE
Glucose, UA: NEGATIVE
Ketones, UA: NEGATIVE
Leukocytes,UA: NEGATIVE
Nitrite, UA: NEGATIVE
Protein,UA: NEGATIVE
Specific Gravity, UA: 1.025 (ref 1.005–1.030)
Urobilinogen, Ur: 0.2 mg/dL (ref 0.2–1.0)
pH, UA: 5.5 (ref 5.0–7.5)

## 2023-06-25 LAB — MICROSCOPIC EXAMINATION

## 2023-06-27 ENCOUNTER — Ambulatory Visit
Admission: RE | Admit: 2023-06-27 | Discharge: 2023-06-27 | Disposition: A | Discharge status: Home / Self Care | Payer: Managed Care, Other (non HMO) | Source: Ambulatory Visit | Attending: Urology | Admitting: Urology

## 2023-06-27 DIAGNOSIS — N50811 Right testicular pain: Secondary | ICD-10-CM | POA: Insufficient documentation

## 2023-06-28 LAB — GC/CHLAMYDIA PROBE AMP
Chlamydia trachomatis, NAA: NEGATIVE
Neisseria Gonorrhoeae by PCR: NEGATIVE

## 2023-06-28 LAB — CULTURE, URINE COMPREHENSIVE

## 2023-07-17 ENCOUNTER — Other Ambulatory Visit: Payer: Self-pay | Admitting: Urology

## 2023-07-17 DIAGNOSIS — R1030 Lower abdominal pain, unspecified: Secondary | ICD-10-CM

## 2023-07-17 NOTE — Telephone Encounter (Signed)
 Patient called today to review his US scrotal results, he had trouble reviewing this on mychart. Reviewed results. Patient is asking how long would it take for the right testicular discomfort to resolve in our opinion? It is a constant 3 throbbing sensation. Sensation is better with wearing tighter underwear but does not resolve. He has not tried any medications for the sensation. Would like our opinion on this.

## 2023-07-25 ENCOUNTER — Ambulatory Visit
Admission: RE | Admit: 2023-07-25 | Discharge: 2023-07-25 | Disposition: A | Discharge status: Home / Self Care | Source: Ambulatory Visit | Attending: Urology | Admitting: Urology

## 2023-07-25 DIAGNOSIS — R1030 Lower abdominal pain, unspecified: Secondary | ICD-10-CM | POA: Diagnosis present

## 2023-08-19 ENCOUNTER — Encounter: Payer: Self-pay | Admitting: Urology

## 2023-08-19 ENCOUNTER — Ambulatory Visit: Payer: Managed Care, Other (non HMO) | Admitting: Urology

## 2023-08-19 VITALS — BP 112/75 | HR 67 | Ht 70.0 in | Wt 172.0 lb

## 2023-08-19 DIAGNOSIS — N35812 Other urethral bulbous stricture, male: Secondary | ICD-10-CM

## 2023-08-19 LAB — URINALYSIS, COMPLETE
Bilirubin, UA: NEGATIVE
Glucose, UA: NEGATIVE
Ketones, UA: NEGATIVE
Leukocytes,UA: NEGATIVE
Nitrite, UA: NEGATIVE
Protein,UA: NEGATIVE
Specific Gravity, UA: 1.025 (ref 1.005–1.030)
Urobilinogen, Ur: 0.2 mg/dL (ref 0.2–1.0)
pH, UA: 6.5 (ref 5.0–7.5)

## 2023-08-19 LAB — MICROSCOPIC EXAMINATION

## 2023-08-19 NOTE — Progress Notes (Signed)
   08/19/23  CC:  Chief Complaint  Patient presents with   Cysto    HPI: 48 yo M with history of mild bulbar urethral stricture who presents today for evaluation of worsening urinary symptoms.    Specifically today, he feels like his stream is restricted.  When he presses up on his perineum on his urethra, and he is able to void with a better stream.  Blood pressure 112/75, pulse 67, height 5\' 10"  (1.778 m), weight 172 lb (78 kg). NED. A&Ox3.   No respiratory distress   Abd soft, NT, ND Normal phallus with bilateral descended testicles  Cystoscopy Procedure Note  Patient identification was confirmed, informed consent was obtained, and patient was prepped using Betadine solution.  Lidocaine jelly was administered per urethral meatus.     Pre-Procedure: - Inspection reveals a normal caliber ureteral meatus.  Procedure: The flexible cystoscope was introduced without difficulty - Very mild approximately 16 French bulbar urethral stricture with apparent in the distal bulb.  Once the penis was stretched, the scope easily traversed this area. - Normal prostate  - Normal bladder neck - Bilateral ureteral orifices identified - Bladder mucosa  reveals no ulcers, tumors, or lesions - No bladder stones - No trabeculation  Retroflexion    Post-Procedure: - Patient tolerated the procedure well  Assessment/ Plan:  1. 1. 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 -He may be interested in Optilume.  We discussed the risk and benefits, recurrence rates, need for postoperative Foley catheter amongst others.  He will talk to his wife and let us  know how he would like to proceed. - Urinalysis, Complete    Dustin Gimenez, MD

## 2023-08-21 ENCOUNTER — Encounter: Payer: Self-pay | Admitting: *Deleted

## 2023-10-28 ENCOUNTER — Encounter: Payer: Self-pay | Admitting: *Deleted

## 2023-11-17 HISTORY — DX: Other chronic pain: G89.29

## 2023-11-17 HISTORY — DX: Colostomy status: Z93.3

## 2023-11-17 HISTORY — DX: Diverticulitis of large intestine with perforation and abscess without bleeding: K57.20

## 2023-11-17 HISTORY — DX: Generalized anxiety disorder: F41.1

## 2023-11-17 HISTORY — DX: Other specified diseases of intestine: K63.89

## 2023-11-27 ENCOUNTER — Encounter: Payer: Self-pay | Admitting: Gastroenterology

## 2023-12-08 ENCOUNTER — Ambulatory Visit

## 2023-12-08 ENCOUNTER — Ambulatory Visit
Admission: RE | Admit: 2023-12-08 | Discharge: 2023-12-08 | Disposition: A | Discharge status: Home / Self Care | Attending: Gastroenterology | Admitting: Gastroenterology

## 2023-12-08 ENCOUNTER — Encounter
Admission: RE | Disposition: A | Discharge status: Home / Self Care | Payer: Self-pay | Source: Home / Self Care | Attending: Gastroenterology

## 2023-12-08 ENCOUNTER — Encounter: Payer: Self-pay | Admitting: Gastroenterology

## 2023-12-08 DIAGNOSIS — K5750 Diverticulosis of both small and large intestine without perforation or abscess without bleeding: Secondary | ICD-10-CM | POA: Insufficient documentation

## 2023-12-08 DIAGNOSIS — K529 Noninfective gastroenteritis and colitis, unspecified: Secondary | ICD-10-CM | POA: Diagnosis not present

## 2023-12-08 DIAGNOSIS — Z87891 Personal history of nicotine dependence: Secondary | ICD-10-CM | POA: Diagnosis not present

## 2023-12-08 DIAGNOSIS — Z79899 Other long term (current) drug therapy: Secondary | ICD-10-CM | POA: Insufficient documentation

## 2023-12-08 DIAGNOSIS — Z980 Intestinal bypass and anastomosis status: Secondary | ICD-10-CM | POA: Insufficient documentation

## 2023-12-08 DIAGNOSIS — D128 Benign neoplasm of rectum: Secondary | ICD-10-CM | POA: Insufficient documentation

## 2023-12-08 DIAGNOSIS — Z1211 Encounter for screening for malignant neoplasm of colon: Secondary | ICD-10-CM | POA: Insufficient documentation

## 2023-12-08 DIAGNOSIS — K219 Gastro-esophageal reflux disease without esophagitis: Secondary | ICD-10-CM | POA: Insufficient documentation

## 2023-12-08 HISTORY — PX: COLONOSCOPY: SHX5424

## 2023-12-08 HISTORY — PX: ESOPHAGOGASTRODUODENOSCOPY: SHX5428

## 2023-12-08 HISTORY — PX: POLYPECTOMY: SHX149

## 2023-12-08 SURGERY — COLONOSCOPY
Anesthesia: General

## 2023-12-08 MED ORDER — LIDOCAINE HCL (CARDIAC) PF 100 MG/5ML IV SOSY
PREFILLED_SYRINGE | INTRAVENOUS | Status: DC | PRN
  Administered 2023-12-08: 40 mg via INTRAVENOUS

## 2023-12-08 MED ORDER — SODIUM CHLORIDE 0.9 % IV SOLN
INTRAVENOUS | Status: DC
  Administered 2023-12-08: 500 mL via INTRAVENOUS

## 2023-12-08 MED ORDER — PROPOFOL 1000 MG/100ML IV EMUL
INTRAVENOUS | Status: AC
  Filled 2023-12-08: qty 100

## 2023-12-08 MED ORDER — GLYCOPYRROLATE 0.2 MG/ML IJ SOLN
INTRAMUSCULAR | Status: AC
  Filled 2023-12-08: qty 1

## 2023-12-08 MED ORDER — GLYCOPYRROLATE 0.2 MG/ML IJ SOLN
INTRAMUSCULAR | Status: DC | PRN
  Administered 2023-12-08: .2 mg via INTRAVENOUS

## 2023-12-08 MED ORDER — LIDOCAINE HCL (PF) 2 % IJ SOLN
INTRAMUSCULAR | Status: AC
  Filled 2023-12-08: qty 5

## 2023-12-08 MED ORDER — PROPOFOL 10 MG/ML IV BOLUS
INTRAVENOUS | Status: DC | PRN
  Administered 2023-12-08: 150 mg via INTRAVENOUS
  Administered 2023-12-08: 120 ug/kg/min via INTRAVENOUS

## 2023-12-08 MED ORDER — PHENYLEPHRINE 80 MCG/ML (10ML) SYRINGE FOR IV PUSH (FOR BLOOD PRESSURE SUPPORT)
PREFILLED_SYRINGE | INTRAVENOUS | Status: DC | PRN
  Administered 2023-12-08: 80 ug via INTRAVENOUS

## 2023-12-08 NOTE — Anesthesia Postprocedure Evaluation (Signed)
 Anesthesia Post Note  Patient: Louis Singh  Procedure(s) Performed: COLONOSCOPY EGD (ESOPHAGOGASTRODUODENOSCOPY)  Patient location during evaluation: PACU Anesthesia Type: General Level of consciousness: awake and awake and alert Pain management: satisfactory to patient Vital Signs Assessment: post-procedure vital signs reviewed and stable Respiratory status: spontaneous breathing Cardiovascular status: stable Anesthetic complications: no   No notable events documented.   Last Vitals:  Vitals:   12/08/23 1124 12/08/23 1134  BP: 120/80 115/83  Pulse: 63 71  Resp: 20 15  Temp:    SpO2: 100% 98%    Last Pain:  Vitals:   12/08/23 1134  TempSrc:   PainSc: 0-No pain                 VAN STAVEREN,Zenovia Justman

## 2023-12-08 NOTE — Op Note (Signed)
 [facility] Patient Name: Louis Singh Procedure Date: 12/08/2023 10:31 AM MRN: [id number] Account #: [id number] Date of Birth: [birth year] Admit Type: Outpatient Age: 48 Room: [facility] ENDO ROOM 2 Gender: Male Note Status: Finalized Instrument Name: Colonoscope [id number] Procedure:             Colonoscopy Indications:           Screening for colorectal malignant neoplasm Providers:             [name] DO, DO Medicines:             Monitored Anesthesia Care Complications:         No immediate complications. Estimated blood loss:                         Minimal. Procedure:             Pre-Anesthesia Assessment:                        - Prior to the procedure, a History and Physical was                         performed, and patient medications and allergies were                         reviewed. The patient is competent. The risks and                         benefits of the procedure and the sedation options and                         risks were discussed with the patient. All questions                         were answered and informed consent was obtained.                         Patient identification and proposed procedure were                         verified by the physician, the nurse, the anesthetist                         and the technician in the endoscopy suite. Mental                         Status Examination: alert and oriented. Airway                         Examination: normal oropharyngeal airway and neck                         mobility. Respiratory Examination: clear to                         auscultation. CV Examination: RRR, no murmurs, no S3                         or S4. Prophylactic Antibiotics: The patient does not  require prophylactic antibiotics. Prior                         Anticoagulants: The patient has taken no anticoagulant                         or antiplatelet agents. ASA Grade  Assessment: II - A                         patient with mild systemic disease. After reviewing                         the risks and benefits, the patient was deemed in                         satisfactory condition to undergo the procedure. The                         anesthesia plan was to use monitored anesthesia care                         (MAC). Immediately prior to administration of                         medications, the patient was re-assessed for adequacy                         to receive sedatives. The heart rate, respiratory                         rate, oxygen saturations, blood pressure, adequacy of                         pulmonary ventilation, and response to care were                         monitored throughout the procedure. The physical                         status of the patient was re-assessed after the                         procedure.                        After obtaining informed consent, the colonoscope was                         passed under direct vision. Throughout the procedure,                         the patient's blood pressure, pulse, and oxygen                         saturations were monitored continuously. The                         Colonoscope was introduced through the anus and  advanced to the the terminal ileum, with                         identification of the appendiceal orifice and IC                         valve. The colonoscopy was performed without                         difficulty. The patient tolerated the procedure well.                         The quality of the bowel preparation was evaluated                         using the BBPS [facility] Bowel Preparation Scale) with                         scores of: Right Colon = 3, Transverse Colon = 3 and                         Left Colon = 3 (entire mucosa seen well with no                         residual staining, small fragments of stool or opaque                          liquid). The total BBPS score equals 9. The terminal                         ileum, ileocecal valve, appendiceal orifice, and                         rectum were photographed. Findings:      The perianal and digital rectal examinations were normal. Pertinent       negatives include normal sphincter tone.      Localized mild inflammation characterized by erosions and granularity       was found in the terminal ileum. Biopsies were taken with a cold forceps       for histology. Estimated blood loss was minimal.      The remainder of the exam in the terminal ileum was normal.      Retroflexion in the right colon was performed.      A few small-mouthed diverticula were found in the left colon. Estimated       blood loss: none.      A 1 to 2 mm polyp was found in the rectum. The polyp was sessile. The       polyp was removed with a jumbo cold forceps. Resection and retrieval       were complete. Estimated blood loss was minimal.      There was evidence of a prior end-to-side colo-colonic anastomosis in       the sigmoid colon. This was patent and was characterized by ulceration.       The anastomosis was traversed. Estimated blood loss: none.      The exam was otherwise without abnormality on direct and retroflexion       views. Impression:            -  Mild inflammation was found in the ileum secondary                         to ileitis. Biopsied.                        - Diverticulosis in the left colon.                        - One 1 to 2 mm polyp in the rectum, removed with a                         jumbo cold forceps. Resected and retrieved.                        - Patent end-to-side colo-colonic anastomosis,                         characterized by ulceration.                        - The examination was otherwise normal on direct and                         retroflexion views. Recommendation:        - Patient has a contact number available for                          emergencies. The signs and symptoms of potential                         delayed complications were discussed with the patient.                         Return to normal activities tomorrow. Written                         discharge instructions were provided to the patient.                        - Discharge patient to home.                        - Resume previous diet.                        - Continue present medications.                        - Await pathology results.                        - Repeat colonoscopy for surveillance based on                         pathology results.                        - Return to referring physician as previously  scheduled.                        - The findings and recommendations were discussed with                         the patient. Procedure Code(s):     --- Professional ---                        [phone], Colonoscopy, flexible; with biopsy, single or                         multiple Diagnosis Code(s):     --- Professional ---                        Z12.11, Encounter for screening for malignant neoplasm                         of colon                        K52.9, Noninfective gastroenteritis and colitis,                         unspecified                        D12.8, Benign neoplasm of rectum                        Z98.0, Intestinal bypass and anastomosis status                        K57.30, Diverticulosis of large intestine without                         perforation or abscess without bleeding CPT copyright 2022 American Medical Association. All rights reserved. The codes documented in this report are preliminary and upon coder review may  be revised to meet current compliance requirements. Attending Participation:      I personally performed the entire procedure. Elspeth Jungling, DO [name] DO, DO 12/08/2023 11:20:32 AM This report has been signed electronically. Number of Addenda: 0 Note Initiated  On: 12/08/2023 10:31 AM Scope Withdrawal Time: 0 hours 12 minutes 34 seconds  Total Procedure Duration: 0 hours 15 minutes 16 seconds  Estimated Blood Loss:  Estimated blood loss was minimal.      [facility]

## 2023-12-08 NOTE — Anesthesia Preprocedure Evaluation (Signed)
 Anesthesia Evaluation  Patient identified by MRN, date of birth, ID band Patient awake    Reviewed: Allergy & Precautions, NPO status , Patient's Chart, lab work & pertinent test results  Airway Mallampati: II  TM Distance: >3 FB Neck ROM: full    Dental  (+) Teeth Intact   Pulmonary neg pulmonary ROS, Patient abstained from smoking., former smoker   Pulmonary exam normal breath sounds clear to auscultation       Cardiovascular Exercise Tolerance: Good negative cardio ROS Normal cardiovascular exam Rhythm:Regular Rate:Normal     Neuro/Psych   Anxiety     negative neurological ROS  negative psych ROS   GI/Hepatic negative GI ROS, Neg liver ROS,GERD  Medicated,,  Endo/Other  negative endocrine ROS    Renal/GU negative Renal ROS  negative genitourinary   Musculoskeletal   Abdominal   Peds negative pediatric ROS (+)  Hematology negative hematology ROS (+)   Anesthesia Other Findings Past Medical History: No date: Anxiety, generalized No date: Chronic TMJ pain No date: Colonic edema No date: Colostomy status (HCC) No date: Diverticulitis of colon with perforation No date: Diverticulosis No date: GERD (gastroesophageal reflux disease) No date: Tobacco abuse  Past Surgical History: 02/17/2020: COLECTOMY WITH COLOSTOMY CREATION/HARTMANN PROCEDURE; N/A     Comment:  Procedure: COLECTOMY WITH COLOSTOMY CREATION/HARTMANN               PROCEDURE;  Surgeon: Desiderio Schanz, MD;  Location: [facility]               ORS;  Service: General;  Laterality: N/A; 08/18/2020: COLONOSCOPY WITH PROPOFOL ; N/A     Comment:  Procedure: COLONOSCOPY WITH PROPOFOL ;  Surgeon:               Janalyn Keene NOVAK, MD;  Location: [facility] ENDOSCOPY;                Service: Endoscopy;  Laterality: N/A; 09/12/2020: CYSTOSCOPY; N/A     Comment:  Procedure: CYSTOSCOPY;  Surgeon: Penne Knee, MD;                Location: [facility] ORS;  Service: Urology;   Laterality: N/A; 1984: facial recontruction 09/12/2020: XI ROBOTIC ASSISTED COLOSTOMY TAKEDOWN; N/A     Comment:  Procedure: XI ROBOTIC ASSISTED COLOSTOMY TAKEDOWN with               Zach Schultz PA-C to assist;  Surgeon: Desiderio Schanz, MD;              Location: [facility] ORS;  Service: General;  Laterality: N/A;               Provider requesting 4 hours / 240 minutes for procedure.  BMI    Body Mass Index: 23.53 kg/m      Reproductive/Obstetrics negative OB ROS                              Anesthesia Physical Anesthesia Plan  ASA: 2  Anesthesia Plan: General   Post-op Pain Management:    Induction: Intravenous  PONV Risk Score and Plan: Propofol  infusion and TIVA  Airway Management Planned: Natural Airway and Nasal Cannula  Additional Equipment:   Intra-op Plan:   Post-operative Plan:   Informed Consent: I have reviewed the patients History and Physical, chart, labs and discussed the procedure including the risks, benefits and alternatives for the proposed anesthesia with the patient or authorized representative who has indicated his/her understanding and  acceptance.     Dental Advisory Given  Plan Discussed with: CRNA  Anesthesia Plan Comments:         Anesthesia Quick Evaluation

## 2023-12-08 NOTE — H&P (Signed)
 Pre-Procedure H&P   Patient ID: Louis Singh is a 48 y.o. male.  Gastroenterology Provider: [name], DO  Referring Provider: Romero Antigua, PA PCP: Diedra Lame, MD  Date: 12/08/2023  HPI Mr. Louis Singh is a 48 y.o. male who presents today for Esophagogastroduodenoscopy and Colonoscopy for GERD, colorectal cancer screening .  Reports bowel movement most days without melena or hematochezia.  He notes increasing epigastric discomfort and reflux with beer and spicy foods.  He denies any dysphagia or odynophagia.  History of complicated diverticulitis with perforation.  Status post Hartman's with reanastomosis in 2022.  Last underwent Mental evaluation prior to reanastomosis in April 2022   Past Medical History:  Diagnosis Date   Anxiety, generalized    Chronic TMJ pain    Colonic edema    Colostomy status (HCC)    Diverticulitis of colon with perforation    Diverticulosis    GERD (gastroesophageal reflux disease)    Tobacco abuse     Past Surgical History:  Procedure Laterality Date   COLECTOMY WITH COLOSTOMY CREATION/HARTMANN PROCEDURE N/A 02/17/2020   Procedure: COLECTOMY WITH COLOSTOMY CREATION/HARTMANN PROCEDURE;  Surgeon: Desiderio Schanz, MD;  Location: [facility] ORS;  Service: General;  Laterality: N/A;   COLONOSCOPY WITH PROPOFOL  N/A 08/18/2020   Procedure: COLONOSCOPY WITH PROPOFOL ;  Surgeon: Janalyn Keene NOVAK, MD;  Location: [facility] ENDOSCOPY;  Service: Endoscopy;  Laterality: N/A;   CYSTOSCOPY N/A 09/12/2020   Procedure: CYSTOSCOPY;  Surgeon: Penne Knee, MD;  Location: [facility] ORS;  Service: Urology;  Laterality: N/A;   facial recontruction  1984   XI ROBOTIC ASSISTED COLOSTOMY TAKEDOWN N/A 09/12/2020   Procedure: XI ROBOTIC ASSISTED COLOSTOMY TAKEDOWN with Zach Schultz PA-C to assist;  Surgeon: Desiderio Schanz, MD;  Location: [facility] ORS;  Service: General;  Laterality: N/A;  Provider requesting 4 hours / 240 minutes for procedure.     Family History No h/o GI disease or malignancy  Review of Systems  Constitutional:  Negative for activity change, appetite change, chills, diaphoresis, fatigue, fever and unexpected weight change.  HENT:  Negative for trouble swallowing and voice change.   Respiratory:  Negative for shortness of breath and wheezing.   Cardiovascular:  Negative for chest pain, palpitations and leg swelling.  Gastrointestinal:  Negative for abdominal distention, abdominal pain, anal bleeding, blood in stool, constipation, diarrhea, nausea and vomiting.  Musculoskeletal:  Negative for arthralgias and myalgias.  Skin:  Negative for color change and pallor.  Neurological:  Negative for dizziness, syncope and weakness.  Psychiatric/Behavioral:  Negative for confusion. The patient is not nervous/anxious.   All other systems reviewed and are negative.    Medications No current facility-administered medications on file prior to encounter.   Current Outpatient Medications on File Prior to Encounter  Medication Sig Dispense Refill   albuterol (VENTOLIN HFA) 108 (90 Base) MCG/ACT inhaler Inhale 2 puffs into the lungs every 6 (six) hours as needed for wheezing or shortness of breath.     pantoprazole  (PROTONIX ) 40 MG tablet Take 40 mg by mouth daily.      Pertinent medications related to GI and procedure were reviewed by me with the patient prior to the procedure   Current Facility-Administered Medications:    0.9 %  sodium chloride  infusion, , Intravenous, Continuous, Locklear, Ole DASEN, MD  sodium chloride          Allergies  Allergen Reactions   Shellfish Allergy Nausea And Vomiting and Other (See Comments)   Ciprofloxacin  Anxiety    Intolerance -  increased anxiety    Metronidazole  Anxiety    Intolerance- increases anxiety   Allergies were reviewed by me prior to the procedure  Objective   Body mass index is 23.53 kg/m. Vitals:   12/08/23 1020  BP: 113/71  Pulse: (!) 56  Resp: 18   Temp: (!) 97 F (36.1 C)  TempSrc: Temporal  SpO2: 100%  Weight: 74.4 kg  Height: 5' 10 (1.778 m)     Physical Exam Vitals and nursing note reviewed.  Constitutional:      General: He is not in acute distress.    Appearance: Normal appearance. He is not ill-appearing, toxic-appearing or diaphoretic.  HENT:     Head: Normocephalic and atraumatic.     Nose: Nose normal.     Mouth/Throat:     Mouth: Mucous membranes are moist.     Pharynx: Oropharynx is clear.  Eyes:     General: No scleral icterus.    Extraocular Movements: Extraocular movements intact.  Cardiovascular:     Rate and Rhythm: Regular rhythm. Bradycardia present.     Heart sounds: Normal heart sounds. No murmur heard.    No friction rub. No gallop.  Pulmonary:     Effort: Pulmonary effort is normal. No respiratory distress.     Breath sounds: Normal breath sounds. No wheezing, rhonchi or rales.  Abdominal:     General: Bowel sounds are normal. There is no distension.     Palpations: Abdomen is soft.     Tenderness: There is no abdominal tenderness. There is no guarding or rebound.  Musculoskeletal:     Cervical back: Neck supple.     Right lower leg: No edema.     Left lower leg: No edema.  Skin:    General: Skin is warm and dry.     Coloration: Skin is not jaundiced or pale.  Neurological:     General: No focal deficit present.     Mental Status: He is alert and oriented to person, place, and time. Mental status is at baseline.  Psychiatric:        Mood and Affect: Mood normal.        Behavior: Behavior normal.        Thought Content: Thought content normal.        Judgment: Judgment normal.      Assessment:  Mr. Louis Singh is a 48 y.o. male  who presents today for Esophagogastroduodenoscopy and Colonoscopy for GERD, colorectal cancer screening .  Plan:  Esophagogastroduodenoscopy and Colonoscopy with possible intervention today  Esophagogastroduodenoscopy and Colonoscopy with  possible biopsy, control of bleeding, polypectomy, and interventions as necessary has been discussed with the patient/patient representative. Informed consent was obtained from the patient/patient representative after explaining the indication, nature, and risks of the procedure including but not limited to death, bleeding, perforation, missed neoplasm/lesions, cardiorespiratory compromise, and reaction to medications. Opportunity for questions was given and appropriate answers were provided. Patient/patient representative has verbalized understanding is amenable to undergoing the procedure.   [name], DO  [facility]  Portions of the record may have been created with voice recognition software. Occasional wrong-word or 'sound-a-like' substitutions may have occurred due to the inherent limitations of voice recognition software.  Read the chart carefully and recognize, using context, where substitutions may have occurred.

## 2023-12-08 NOTE — OR Nursing (Signed)
 Louis Singh is an employee of [employer]. [phone] was called at 1121 to request for specimen pick up in the main lab, in the blue bin. Darlene took this call and provided a confirmation number [phone]. The bag containing 2 specimen bottles were taken to the hospital main lab. [employer] was written on both sides of the bag as well as on the bottles.

## 2023-12-08 NOTE — Interval H&P Note (Signed)
 History and Physical Interval Note: Preprocedure H&P from 12/08/23  was reviewed and there was no interval change after seeing and examining the patient.  Written consent was obtained from the patient after discussion of risks, benefits, and alternatives. Patient has consented to proceed with Esophagogastroduodenoscopy and Colonoscopy with possible intervention   12/08/2023 10:36 AM  Louis Singh  has presented today for surgery, with the diagnosis of Colon Cancer Screening GERD.  The various methods of treatment have been discussed with the patient and family. After consideration of risks, benefits and other options for treatment, the patient has consented to  Procedure(s): COLONOSCOPY (N/A) EGD (ESOPHAGOGASTRODUODENOSCOPY) (N/A) as a surgical intervention.  The patient's history has been reviewed, patient examined, no change in status, stable for surgery.  I have reviewed the patient's chart and labs.  Questions were answered to the patient's satisfaction.     [name]

## 2023-12-08 NOTE — Transfer of Care (Signed)
 Immediate Anesthesia Transfer of Care Note  Patient: Louis Singh  Procedure(s) Performed: COLONOSCOPY EGD (ESOPHAGOGASTRODUODENOSCOPY)  Patient Location: PACU and Endoscopy Unit  Anesthesia Type:MAC  Level of Consciousness: sedated  Airway & Oxygen Therapy: Patient Spontanous Breathing  Post-op Assessment: Report given to RN and Post -op Vital signs reviewed and stable  Post vital signs: Reviewed and stable  Last Vitals:  Vitals Value Taken Time  BP 93/64 12/08/23 11:14  Temp 36.1 C 12/08/23 11:14  Pulse 65 12/08/23 11:15  Resp 12 12/08/23 11:15  SpO2 99 % 12/08/23 11:15  Vitals shown include unfiled device data.  Last Pain:  Vitals:   12/08/23 1114  TempSrc: Tympanic  PainSc: Asleep         Complications: No notable events documented.

## 2023-12-08 NOTE — Op Note (Signed)
 [facility] Patient Name: Louis Singh Procedure Date: 12/08/2023 10:31 AM MRN: [id number] Account #: [id number] Date of Birth: 04/27/1976 Admit Type: Outpatient Age: 48 Room: [facility] ENDO ROOM 2 Gender: Male Note Status: Finalized Instrument Name: Upper Endoscope [id number] Procedure:             Upper GI endoscopy Indications:           Suspected esophageal reflux Providers:             [name] DO, DO Medicines:             Monitored Anesthesia Care Complications:         No immediate complications. Estimated blood loss: None. Procedure:             Pre-Anesthesia Assessment:                        - Prior to the procedure, a History and Physical was                         performed, and patient medications and allergies were                         reviewed. The patient is competent. The risks and                         benefits of the procedure and the sedation options and                         risks were discussed with the patient. All questions                         were answered and informed consent was obtained.                         Patient identification and proposed procedure were                         verified by the physician, the nurse, the anesthetist                         and the technician in the endoscopy suite. Mental                         Status Examination: alert and oriented. Airway                         Examination: normal oropharyngeal airway and neck                         mobility. Respiratory Examination: clear to                         auscultation. CV Examination: RRR, no murmurs, no S3                         or S4. Prophylactic Antibiotics: The patient does not  require prophylactic antibiotics. Prior                         Anticoagulants: The patient has taken no anticoagulant                         or antiplatelet agents. ASA Grade Assessment: II - A                          patient with mild systemic disease. After reviewing                         the risks and benefits, the patient was deemed in                         satisfactory condition to undergo the procedure. The                         anesthesia plan was to use monitored anesthesia care                         (MAC). Immediately prior to administration of                         medications, the patient was re-assessed for adequacy                         to receive sedatives. The heart rate, respiratory                         rate, oxygen saturations, blood pressure, adequacy of                         pulmonary ventilation, and response to care were                         monitored throughout the procedure. The physical                         status of the patient was re-assessed after the                         procedure.                        After obtaining informed consent, the endoscope was                         passed under direct vision. Throughout the procedure,                         the patient's blood pressure, pulse, and oxygen                         saturations were monitored continuously. The Endoscope                         was introduced through the mouth, and advanced to the  second part of duodenum. The upper GI endoscopy was                         accomplished without difficulty. The patient tolerated                         the procedure well. Findings:      A medium non-bleeding diverticulum was found in the second portion of       the duodenum. Estimated blood loss: none.      The exam of the duodenum was otherwise normal.      The entire examined stomach was normal. Estimated blood loss: none.      The Z-line was regular. Estimated blood loss: none.      Esophagogastric landmarks were identified: the gastroesophageal junction       was found at 45 cm from the incisors.      The examined esophagus was normal. Estimated blood loss:  none. Impression:            - Non-bleeding duodenal diverticulum.                        - Normal stomach.                        - Z-line regular.                        - Esophagogastric landmarks identified.                        - Normal esophagus.                        - No specimens collected. Recommendation:        - Patient has a contact number available for                         emergencies. The signs and symptoms of potential                         delayed complications were discussed with the patient.                         Return to normal activities tomorrow. Written                         discharge instructions were provided to the patient.                        - Discharge patient to home.                        - Resume previous diet.                        - Avoid trigger foods.                        - Continue present medications.                        - Consider SIBO testing  if symptoms develop/persist                        - Return to GI clinic as previously scheduled.                        - The findings and recommendations were discussed with                         the patient. Procedure Code(s):     --- Professional ---                        [phone], Esophagogastroduodenoscopy, flexible,                         transoral; diagnostic, including collection of                         specimen(s) by brushing or washing, when performed                         (separate procedure) Diagnosis Code(s):     --- Professional ---                        K57.10, Diverticulosis of small intestine without                         perforation or abscess without bleeding CPT copyright 2022 [employer]. All rights reserved. The codes documented in this report are preliminary and upon coder review may  be revised to meet current compliance requirements. Attending Participation:      I personally performed the entire procedure. Elspeth Jungling, DO [name] DO, DO 12/08/2023 10:51:58 AM This report has been signed electronically. Number of Addenda: 0 Note Initiated On: 12/08/2023 10:31 AM Estimated Blood Loss:  Estimated blood loss: none.      [facility]

## 2023-12-09 ENCOUNTER — Encounter: Payer: Self-pay | Admitting: Gastroenterology
